# Patient Record
Sex: Female | Born: 1960 | Race: Black or African American | Hispanic: No | Marital: Married | Smoking: Former smoker
Health system: Southern US, Community
[De-identification: ages and names within clinical notes are randomized; demographics above are authoritative.]

## PROBLEM LIST (undated history)

## (undated) DIAGNOSIS — K5790 Diverticulosis of intestine, part unspecified, without perforation or abscess without bleeding: Secondary | ICD-10-CM

## (undated) DIAGNOSIS — H269 Unspecified cataract: Secondary | ICD-10-CM

## (undated) DIAGNOSIS — E785 Hyperlipidemia, unspecified: Secondary | ICD-10-CM

## (undated) DIAGNOSIS — H8109 Meniere's disease, unspecified ear: Secondary | ICD-10-CM

## (undated) DIAGNOSIS — K219 Gastro-esophageal reflux disease without esophagitis: Secondary | ICD-10-CM

## (undated) DIAGNOSIS — R011 Cardiac murmur, unspecified: Secondary | ICD-10-CM

## (undated) DIAGNOSIS — M1990 Unspecified osteoarthritis, unspecified site: Secondary | ICD-10-CM

## (undated) DIAGNOSIS — M810 Age-related osteoporosis without current pathological fracture: Secondary | ICD-10-CM

## (undated) DIAGNOSIS — L409 Psoriasis, unspecified: Secondary | ICD-10-CM

## (undated) DIAGNOSIS — Z973 Presence of spectacles and contact lenses: Secondary | ICD-10-CM

## (undated) DIAGNOSIS — T7840XA Allergy, unspecified, initial encounter: Secondary | ICD-10-CM

## (undated) DIAGNOSIS — F419 Anxiety disorder, unspecified: Secondary | ICD-10-CM

## (undated) DIAGNOSIS — J302 Other seasonal allergic rhinitis: Secondary | ICD-10-CM

## (undated) DIAGNOSIS — C50919 Malignant neoplasm of unspecified site of unspecified female breast: Secondary | ICD-10-CM

## (undated) HISTORY — PX: COLONOSCOPY: SHX174

## (undated) HISTORY — DX: Psoriasis, unspecified: L40.9

## (undated) HISTORY — DX: Allergy, unspecified, initial encounter: T78.40XA

## (undated) HISTORY — DX: Unspecified osteoarthritis, unspecified site: M19.90

## (undated) HISTORY — DX: Malignant neoplasm of unspecified site of unspecified female breast: C50.919

## (undated) HISTORY — DX: Age-related osteoporosis without current pathological fracture: M81.0

## (undated) HISTORY — DX: Cardiac murmur, unspecified: R01.1

## (undated) HISTORY — DX: Gastro-esophageal reflux disease without esophagitis: K21.9

## (undated) HISTORY — DX: Diverticulosis of intestine, part unspecified, without perforation or abscess without bleeding: K57.90

## (undated) HISTORY — DX: Meniere's disease, unspecified ear: H81.09

## (undated) HISTORY — DX: Unspecified cataract: H26.9

## (undated) HISTORY — DX: Hyperlipidemia, unspecified: E78.5

---

## 1989-02-09 HISTORY — PX: ABDOMINAL HYSTERECTOMY: SHX81

## 2000-03-23 ENCOUNTER — Encounter: Admission: RE | Admit: 2000-03-23 | Discharge: 2000-03-23 | Payer: Self-pay | Admitting: Obstetrics & Gynecology

## 2000-03-26 ENCOUNTER — Encounter: Admission: RE | Admit: 2000-03-26 | Discharge: 2000-03-26 | Payer: Self-pay | Admitting: Obstetrics & Gynecology

## 2000-03-26 ENCOUNTER — Encounter: Payer: Self-pay | Admitting: Obstetrics & Gynecology

## 2000-03-31 ENCOUNTER — Encounter (INDEPENDENT_AMBULATORY_CARE_PROVIDER_SITE_OTHER): Payer: Self-pay | Admitting: Specialist

## 2000-03-31 ENCOUNTER — Ambulatory Visit (HOSPITAL_BASED_OUTPATIENT_CLINIC_OR_DEPARTMENT_OTHER): Admission: RE | Admit: 2000-03-31 | Discharge: 2000-03-31 | Payer: Self-pay | Admitting: General Surgery

## 2000-03-31 HISTORY — PX: BREAST EXCISIONAL BIOPSY: SUR124

## 2001-12-19 ENCOUNTER — Encounter: Payer: Self-pay | Admitting: *Deleted

## 2001-12-19 ENCOUNTER — Emergency Department (HOSPITAL_COMMUNITY): Admission: EM | Admit: 2001-12-19 | Discharge: 2001-12-19 | Payer: Self-pay | Admitting: *Deleted

## 2001-12-19 ENCOUNTER — Encounter: Payer: Self-pay | Admitting: Emergency Medicine

## 2002-12-26 ENCOUNTER — Ambulatory Visit (HOSPITAL_COMMUNITY): Admission: RE | Admit: 2002-12-26 | Discharge: 2002-12-26 | Payer: Self-pay | Admitting: Gastroenterology

## 2003-09-12 ENCOUNTER — Emergency Department (HOSPITAL_COMMUNITY): Admission: EM | Admit: 2003-09-12 | Discharge: 2003-09-12 | Payer: Self-pay

## 2006-02-09 HISTORY — PX: BREAST EXCISIONAL BIOPSY: SUR124

## 2006-06-03 ENCOUNTER — Encounter: Admission: RE | Admit: 2006-06-03 | Discharge: 2006-06-03 | Payer: Self-pay | Admitting: *Deleted

## 2006-06-14 ENCOUNTER — Encounter: Admission: RE | Admit: 2006-06-14 | Discharge: 2006-06-14 | Payer: Self-pay | Admitting: General Surgery

## 2006-06-15 ENCOUNTER — Ambulatory Visit (HOSPITAL_BASED_OUTPATIENT_CLINIC_OR_DEPARTMENT_OTHER): Admission: RE | Admit: 2006-06-15 | Discharge: 2006-06-15 | Payer: Self-pay | Admitting: General Surgery

## 2006-06-15 ENCOUNTER — Encounter (INDEPENDENT_AMBULATORY_CARE_PROVIDER_SITE_OTHER): Payer: Self-pay | Admitting: Specialist

## 2008-03-05 ENCOUNTER — Other Ambulatory Visit: Admission: RE | Admit: 2008-03-05 | Discharge: 2008-03-05 | Payer: Self-pay | Admitting: Family Medicine

## 2008-07-26 ENCOUNTER — Encounter: Admission: RE | Admit: 2008-07-26 | Discharge: 2008-07-26 | Payer: Self-pay | Admitting: Family Medicine

## 2008-08-01 ENCOUNTER — Encounter: Admission: RE | Admit: 2008-08-01 | Discharge: 2008-08-01 | Payer: Self-pay | Admitting: Family Medicine

## 2010-06-27 NOTE — Op Note (Signed)
NAME:  Tamara Brandt, Tamara Brandt          ACCOUNT NO.:  [id number]   MEDICAL RECORD NO.:  [id number]          PATIENT TYPE:  AMB   LOCATION:  DSC                          FACILITY:  [facility]   PHYSICIAN:  Adolph Pollack, M.D.DATE OF BIRTH:  29-May-1960   DATE OF PROCEDURE:  06/15/2006  DATE OF DISCHARGE:                               OPERATIVE REPORT   PREOPERATIVE DIAGNOSIS:  Complex right breast mass.   POSTOPERATIVE DIAGNOSIS:  Complex right breast mass.   PROCEDURE:  Excision of right breast mass.   SURGEON:  Adolph Pollack, M.D.   ANESTHESIA:  General.   INDICATIONS:  Ms. Marcene Corning is a 50 year old female who noted a mass in  the upper inner quadrant of the right breast on her self-exam.  Mammogram demonstrated multiple nodules/masses in the inner quadrant of  the right breast at the 1 and 2 o'clock position.  They were somewhat  complex and diagnosis uncertain, but by way of radiography, biopsy has  been recommended and she presents for that.  We discussed the procedure  risks and aftercare preoperatively.   TECHNIQUE:  She is seen in the holding area and the right breast marked  with my initials.  She was then brought to the operating room, placed  supine on the operating table, and general anesthetic was administered.  The right breast was sterilely prepped and draped.  In the upper inner  quadrant of the right breast, a curvilinear incision was made through  the skin and subcutaneous tissue.  Flaps were made in all directions by  dissecting some subcutaneous tissue away from the skin.  I was able to  palpate the complex set of masses, and using electrocautery, excised  these along with normal tissue surrounding them.  I then marked the  medial aspect of the specimen with a single suture and the anterior  aspect with a double suture.  This was sent fresh to pathology.   Following this, I inspected the biopsy cavity.  I injected with 0.5%  Marcaine.  Bleeding points were  controlled with electrocautery.  Once  hemostasis was adequate, I then closed the subcutaneous tissue with  interrupted 3-0 Vicryl sutures.  The skin was closed with a 4-0 Monocryl  subcuticular stitch.  Steri-Strips and sterile dressings were applied.   She tolerated the procedure well without any apparent complications and  was taken to the recovery room in satisfactory condition.      Adolph Pollack, M.D.  Electronically Signed     TJR/MEDQ  D:  06/15/2006  T:  06/15/2006  Job:  [id number]   cc:   Carma Leaven, DO

## 2010-06-27 NOTE — Op Note (Signed)
[place]  Patient:    Tamara Brandt, Tamara Brandt                   MRN: [id number] Proc. Date: 03/31/00 Adm. Date:  [id number] Attending:  Tempie Donning CC:         Gordy Savers, M.D.  Dr. Tamela Oddi   Operative Report  PREOPERATIVE DIAGNOSIS:  Left breast mass, multiple papillomas.  POSTOPERATIVE DIAGNOSIS:  Left breast, multiple papillomas, pending pathology.  PROCEDURE:  Excision, left breast mass.  SURGEON:  Gita Kudo, M.D.  ANESTHESIA:  General.  CLINICAL SUMMARY:  A 50 year old woman with tender nodular area in her left breast.  It has been present for several years and enlarging.  Mammogram showed it to be moderately stable but somewhat suspicious, and biopsy was recommended.  Ultrasound also showed multiple nodules.  OPERATIVE FINDINGS:  The area at the 6 oclock position in the left breast appeared to be a well-lobulated and discrete area that was around 6 cm in diameter.  DESCRIPTION OF PROCEDURE:  Under satisfactory general endotracheal anesthesia, the patients left breast and chest were prepped and draped in a standard fashion.  A curved incision made around the areola from the 9 through 3 oclock positions and small skin flaps developed.  The mass was quite evident and was removed using a combination of sharp dissection and cautery.  Bleeders were cauterized.  The entire mass was removed with a small rim of normal-appearing breast tissue.  The breast was then infiltrated with 20 cc of Marcaine for postoperative analgesia and closed in layers with 3-0 Vicryl. Skin edges approximated with interrupted and running 4-0 nylon.  A sterile compressive dressing applied, and the patient went to the recovery room from the operating room in good condition without complication. DD:  03/31/00 TD:  03/31/00 Job: [id number]

## 2010-06-27 NOTE — Op Note (Signed)
NAME:  Tamara Brandt, Tamara Brandt                      ACCOUNT NO.:  [id number]   MEDICAL RECORD NO.:  [id number]                   PATIENT TYPE:  AMB   LOCATION:  ENDO                                 FACILITY:  [facility]   PHYSICIAN:  Graylin Shiver, M.D.                DATE OF BIRTH:  1960-02-18   DATE OF PROCEDURE:  12/26/2002  DATE OF DISCHARGE:                                 OPERATIVE REPORT   PROCEDURE PERFORMED:  Colonoscopy.   INDICATIONS FOR PROCEDURE:  Heme positive stool.   Informed consent was obtained after explanation of the risks of bleeding,  infection, and perforation.   PREMEDICATIONS:  Fentanyl 100 mcg  IV, Versed 10 mg IV.   DESCRIPTION OF PROCEDURE:  With the patient in the left lateral decubitus  position, a rectal exam was performed and no masses were felt.  The Olympus  colonoscope was inserted into the rectum and advanced around the colon to  the cecum.  Cecal landmarks were identified.  The cecum and ascending colon  were normal.  The transverse colon was normal.  The descending colon and  sigmoid revealed diverticulosis.  The rectum was normal.  The patient  tolerated the procedure well without complications.   IMPRESSION:  Diverticulosis of the left colon.                                               Graylin Shiver, M.D.    SFG/MEDQ  D:  12/26/2002  T:  12/26/2002  Job:  [id number]   cc:   Thelma Barge P. Modesto Charon, M.D.  [address]  [place]  [place] [address]  Fax: [phone]

## 2012-01-13 ENCOUNTER — Encounter: Payer: Self-pay | Admitting: Internal Medicine

## 2012-02-04 ENCOUNTER — Ambulatory Visit (INDEPENDENT_AMBULATORY_CARE_PROVIDER_SITE_OTHER): Payer: BC Managed Care – PPO | Admitting: Internal Medicine

## 2012-02-04 ENCOUNTER — Encounter: Payer: Self-pay | Admitting: Internal Medicine

## 2012-02-04 VITALS — BP 130/82 | HR 68 | Ht 65.0 in | Wt 188.0 lb

## 2012-02-04 DIAGNOSIS — K219 Gastro-esophageal reflux disease without esophagitis: Secondary | ICD-10-CM

## 2012-02-04 DIAGNOSIS — R1310 Dysphagia, unspecified: Secondary | ICD-10-CM

## 2012-02-04 DIAGNOSIS — R1013 Epigastric pain: Secondary | ICD-10-CM

## 2012-02-04 DIAGNOSIS — Z1211 Encounter for screening for malignant neoplasm of colon: Secondary | ICD-10-CM

## 2012-02-04 MED ORDER — MOVIPREP 100 G PO SOLR: 1.0000 | Freq: Once | ORAL | Status: DC

## 2012-02-04 NOTE — Patient Instructions (Addendum)
You have been scheduled for an endoscopy and colonoscopy with propofol. Please follow the written instructions given to you at your visit today. Please pick up your prep at the pharmacy within the next 1-3 days. If you use inhalers (even only as needed) or a CPAP machine, please bring them with you on the day of your procedure.  You have been given brochures on reflux, endoscopy, and colonoscopy

## 2012-02-04 NOTE — Progress Notes (Signed)
HISTORY OF PRESENT ILLNESS:  Tamara Brandt is a 51 y.o. female who presents today regarding management of reflux and other GI complaints. The patient states that she was awoken in August 2013 with gastric to pharyngeal regurgitation with associated burning, coughing, and difficulty breathing. Since that time she has had recurrent problems with regurgitation and pyrosis and other symptoms. Initially placed on Dexilant for 10 days. She did not feel this helped. Subsequently 30 days of Nexium, which did not help. Currently 2 weeks into a second course of Nexium, with improvement, though incompletely, and symptoms. She also mentions dysphagia to large pills only. She states that she has been watching her diet recently, using hello juice, and chewing gum. This seems to help. Current symptoms include significant belching, increased intestinal gas, upper abdominal discomfort, sore throat with sour taste in mouth, hoarseness, increased phlegm production, and previous problems with water bran (now resolved). He is due she is now or regurgitation and bloating. She continues on PPI. She has gained approximately 45 pounds over the past 2 years. Some nausea but no vomiting. No lower GI complaints. She is known to have diverticulosis on colonoscopy 10 years ago (Dr. Evette Cristal [facility], November 2004). Review of outside blood work from October 2013 shows unremarkable comprehensive metabolic panel, CBC, and negative Helicobacter pylori antibody  REVIEW OF SYSTEMS:  All non-GI ROS negative except for allergies, anxiety, arthritis, back pain, visual change, heart murmur, itching, night sweats, skin rash, sleeping problems, sore throat, urinary leakage, voice change  Past Medical History  Diagnosis Date  . Diverticulosis   . GERD (gastroesophageal reflux disease)   . Psoriasis     Past Surgical History  Procedure Date  . Abdominal hysterectomy   . Breast lumpectomy     x2  . Cesarean section     Social  History Tamara Brandt  reports that she has quit smoking. She has never used smokeless tobacco. She reports that she does not drink alcohol or use illicit drugs.  family history includes Diabetes in her father; Heart disease in her mother and paternal aunt; and Rheum arthritis in her mother.  Allergies  Allergen Reactions  . Hydrocodone Rash       PHYSICAL EXAMINATION: Vital signs: BP 130/82  Pulse 68  Ht 5\' 5"  (1.651 m)  Wt 188 lb (85.276 kg)  BMI 31.28 kg/m2  Constitutional: generally well-appearing, no acute distress Psychiatric: alert and oriented x3, cooperative Eyes: extraocular movements intact, anicteric, conjunctiva pink Mouth: oral pharynx moist, no lesions Neck: supple no lymphadenopathy Cardiovascular: heart regular rate and rhythm, very soft murmur Lungs: clear to auscultation bilaterally Abdomen: soft,obese, nontender, nondistended, no obvious ascites, no peritoneal signs, normal bowel sounds, no organomegaly Rectal:for until colonoscopy Extremities: no lower extremity edema bilaterally Skin: no lesions on visible extremities Neuro: No focal deficits.   ASSESSMENT:  #1. GERD. Classic symptoms improve with PPI. Atypical symptoms possibly related to GERD, not improved. Significant regurgitant component. Mild dysphagia as well. #2. Dyspepsia, bloating, upper abdominal discomfort #3. Colorectal neoplasia screening. Baseline risk. Due for followup as this is her 10th year upcoming   PLAN:  #1. Long discussion today on the pathophysiology of reflux disease #2. Literature provided on reflux disease #3. Reflux precautions. In particular, weight loss, avoidance of large meals, avoidance of eating close to bedtime #4. Upper endoscopy to evaluate reflux and dyspeptic symptoms as well as mild dysphagia.The nature of the procedure, as well as the risks, benefits, and alternatives were carefully and thoroughly reviewed with the patient.  Ample time for discussion and  questions allowed. The patient understood, was satisfied, and agreed to proceed.  #5. Screening colonoscopy.The nature of the procedure, as well as the risks, benefits, and alternatives were carefully and thoroughly reviewed with the patient. Ample time for discussion and questions allowed. The patient understood, was satisfied, and agreed to proceed.  #6. Movi prep prescribed. The patient instructed on its use #7. Continue PPI

## 2012-02-08 ENCOUNTER — Ambulatory Visit (INDEPENDENT_AMBULATORY_CARE_PROVIDER_SITE_OTHER): Payer: BC Managed Care – PPO | Admitting: Physician Assistant

## 2012-02-08 VITALS — BP 130/76 | HR 101 | Temp 100.0°F | Resp 20 | Wt 183.0 lb

## 2012-02-08 DIAGNOSIS — R509 Fever, unspecified: Secondary | ICD-10-CM

## 2012-02-08 DIAGNOSIS — R059 Cough, unspecified: Secondary | ICD-10-CM

## 2012-02-08 DIAGNOSIS — R05 Cough: Secondary | ICD-10-CM

## 2012-02-08 DIAGNOSIS — J111 Influenza due to unidentified influenza virus with other respiratory manifestations: Secondary | ICD-10-CM

## 2012-02-08 LAB — POCT INFLUENZA A/B: Influenza B, POC: NEGATIVE

## 2012-02-08 MED ORDER — ALBUTEROL SULFATE HFA 108 (90 BASE) MCG/ACT IN AERS: 2.0000 | INHALATION_SPRAY | RESPIRATORY_TRACT | Status: DC | PRN

## 2012-02-08 MED ORDER — OSELTAMIVIR PHOSPHATE 75 MG PO CAPS: 75.0000 mg | ORAL_CAPSULE | Freq: Two times a day (BID) | ORAL | Status: DC

## 2012-02-08 MED ORDER — ALBUTEROL SULFATE (2.5 MG/3ML) 0.083% IN NEBU
2.5000 mg | INHALATION_SOLUTION | Freq: Once | RESPIRATORY_TRACT | Status: AC
  Administered 2012-02-08: 2.5 mg via RESPIRATORY_TRACT

## 2012-02-08 MED ORDER — GUAIFENESIN-CODEINE 100-10 MG/5ML PO SOLN: ORAL | Status: DC

## 2012-02-08 NOTE — Progress Notes (Signed)
Patient ID: Tamara Brandt MRN: [id number], DOB: [birth year], 51 y.o. Date of Encounter: 02/08/2012, 9:52 AM  Primary Physician: Gaye Alken, MD  Chief Complaint: Flu symptoms for 2 days  HPI: 51 y.o. year old female with history below presents a two day history of sudden onset of fever, chills, myalgias, cough, sore throat, and congestion. T max uncertain, she does not have a thermometer at home. Her cough is not productive and is not associated with time of the day. Cough is however keeping her awake at night. When she gets into a coughing episode she will develop some wheezing and shortness of breath, but otherwise she is without wheezing or shortness of breath. She does have a generalized headache that is worse with her coughing. She also has some mild nausea without emesis. No diarrhea. Slightly decreased appetite, but she is eating some soup and pushing fluids. Her grandchildren were sick with similar symptoms over Christmas and did cough in her face a few times. She did receive an influenza vaccine this year a couple months ago through her employer.   Past Medical History  Diagnosis Date  . Diverticulosis   . GERD (gastroesophageal reflux disease)   . Psoriasis   . Allergy   . Arthritis   . Heart murmur      Home Meds: Prior to Admission medications   Medication Sig Start Date End Date Taking? Authorizing Provider  esomeprazole (NEXIUM) 40 MG capsule Take 40 mg by mouth daily before breakfast.   Yes Historical Provider, MD  hydrALAZINE (APRESOLINE) 25 MG tablet Take 25 mg by mouth as needed.   Yes Historical Provider, MD  triamcinolone (KENALOG) 0.025 % cream Apply 1 application topically 2 (two) times daily.   Yes Historical Provider, MD    Allergies:  Allergies  Allergen Reactions  . Hydrocodone Rash    History   Social History  . Marital Status: Married    Spouse Name: N/A    Number of Children: 1  . Years of Education: N/A   Occupational History    . Receiptionist [employer]   Social History Main Topics  . Smoking status: Former Games developer  . Smokeless tobacco: Never Used  . Alcohol Use: No  . Drug Use: No  . Sexually Active: Not on file   Other Topics Concern  . Not on file   Social History Narrative  . No narrative on file     Review of Systems: Constitutional: Positive for fever, chills, myalgias, and fatigue. Negative for weight changes.  HEENT: see above Cardiovascular: Negative for chest pain or palpitations Respiratory: Positive for cough and wheezing. Negative for hemoptysis, shortness of breath, tachypnea, or dyspnea Abdominal: Negative for abdominal pain, nausea, vomiting, diarrhea, or constipation Dermatological: Negative for rash. Neurologic: Positive for headache. Negative for dizziness, vertigo, or syncope.   Physical Exam: Blood pressure 130/76, pulse 101, temperature 100 F (37.8 C), temperature source Oral, resp. rate 20, weight 183 lb (83.008 kg), SpO2 98.00%., There is no height on file to calculate BMI. General: Well developed, well nourished, in no acute distress. Head: Normocephalic, atraumatic, eyes without discharge, sclera non-icteric, nares are congested. Bilateral auditory canals clear, TM's are without perforation, pearly grey and translucent with reflective cone of light bilaterally. Oral cavity moist, posterior pharynx without exudate, erythema, peritonsillar abscess, or post nasal drip. Uvula midline.  Neck: Supple. No thyromegaly. Full ROM. No lymphadenopathy. Lungs: Mild expiratory wheezing bilaterally without rales or rhonchi. Breathing is unlabored. Status post Albuterol nebulizer:  Heart: RRR  with S1 S2. 2/6 murmur. No rubs or gallops appreciated. Msk:  Strength and tone normal for age. Extremities/Skin: Warm and dry. No clubbing or cyanosis. No edema. No rashes or suspicious lesions. Neuro: Alert and oriented X 3. Moves all extremities spontaneously. Gait is normal. CNII-XII grossly in  tact. Psych:  Responds to questions appropriately with a normal affect.   Labs: Results for orders placed in visit on 02/08/12  POCT INFLUENZA A/B      Component Value Range   Influenza A, POC Positive     Influenza B, POC Negative       ASSESSMENT AND PLAN:  51 y.o. year old female with influenza A, fever, and cough. -Tamiflu 75 mg 1 po bid #10 no RF -Hycodan #4oz 1 tsp po q 4-6 hours prn cough no RF, SED -Tylenol/Motrin -Push fluids -Rest -Will need to be out of work until afebrile 24 hours without the aid of antipyretics -Out of work note written through 02/11/12 -RTC precautions  Signed, Eula Listen, PA-C 02/08/2012 9:52 AM

## 2012-03-11 ENCOUNTER — Encounter: Payer: BC Managed Care – PPO | Admitting: Internal Medicine

## 2012-03-24 ENCOUNTER — Encounter: Payer: BC Managed Care – PPO | Admitting: Internal Medicine

## 2012-04-09 DIAGNOSIS — C50919 Malignant neoplasm of unspecified site of unspecified female breast: Secondary | ICD-10-CM

## 2012-04-09 HISTORY — DX: Malignant neoplasm of unspecified site of unspecified female breast: C50.919

## 2012-04-19 ENCOUNTER — Other Ambulatory Visit: Payer: Self-pay

## 2012-04-19 DIAGNOSIS — Z1231 Encounter for screening mammogram for malignant neoplasm of breast: Secondary | ICD-10-CM

## 2012-05-02 ENCOUNTER — Telehealth: Payer: Self-pay | Admitting: Internal Medicine

## 2012-05-04 NOTE — Telephone Encounter (Signed)
Informed patient that I had printed corrected instructions reflecting new date;  Patient requested I fax them to her and agreed to call if she had any questions about the new instructions.  Instructions faxed

## 2012-05-04 NOTE — Telephone Encounter (Signed)
Reprinted corrected instructions reflecting new date of procedure; called patient and LM on VM for her to call me back so I can get fax number to fax the instructions per her request

## 2012-05-09 ENCOUNTER — Ambulatory Visit
Admission: RE | Admit: 2012-05-09 | Discharge: 2012-05-09 | Disposition: A | Discharge status: Home / Self Care | Payer: BC Managed Care – PPO | Source: Ambulatory Visit

## 2012-05-09 DIAGNOSIS — Z1231 Encounter for screening mammogram for malignant neoplasm of breast: Secondary | ICD-10-CM

## 2012-05-11 ENCOUNTER — Encounter: Payer: Self-pay | Admitting: Internal Medicine

## 2012-05-11 ENCOUNTER — Other Ambulatory Visit: Payer: Self-pay | Admitting: Family Medicine

## 2012-05-11 ENCOUNTER — Ambulatory Visit (AMBULATORY_SURGERY_CENTER): Payer: BC Managed Care – PPO | Admitting: Internal Medicine

## 2012-05-11 VITALS — BP 154/68 | HR 51 | Temp 97.6°F | Resp 15 | Ht 65.0 in | Wt 188.0 lb

## 2012-05-11 DIAGNOSIS — R1310 Dysphagia, unspecified: Secondary | ICD-10-CM

## 2012-05-11 DIAGNOSIS — R928 Other abnormal and inconclusive findings on diagnostic imaging of breast: Secondary | ICD-10-CM

## 2012-05-11 DIAGNOSIS — K219 Gastro-esophageal reflux disease without esophagitis: Secondary | ICD-10-CM

## 2012-05-11 DIAGNOSIS — Z1211 Encounter for screening for malignant neoplasm of colon: Secondary | ICD-10-CM

## 2012-05-11 DIAGNOSIS — R1013 Epigastric pain: Secondary | ICD-10-CM

## 2012-05-11 MED ORDER — SODIUM CHLORIDE 0.9 % IV SOLN: 500.0000 mL | INTRAVENOUS | Status: DC

## 2012-05-11 MED ORDER — DEXTROSE 5 % IV SOLN: INTRAVENOUS | Status: DC

## 2012-05-11 NOTE — Patient Instructions (Addendum)
YOU HAD AN ENDOSCOPIC PROCEDURE TODAY AT THE [facility]: Refer to the procedure report that was given to you for any specific questions about what was found during the examination.  If the procedure report does not answer your questions, please call your gastroenterologist to clarify.  If you requested that your care partner not be given the details of your procedure findings, then the procedure report has been included in a sealed envelope for you to review at your convenience later.  YOU SHOULD EXPECT: Some feelings of bloating in the abdomen. Passage of more gas than usual.  Walking can help get rid of the air that was put into your GI tract during the procedure and reduce the bloating. If you had a lower endoscopy (such as a colonoscopy or flexible sigmoidoscopy) you may notice spotting of blood in your stool or on the toilet paper. If you underwent a bowel prep for your procedure, then you may not have a normal bowel movement for a few days.  DIET: Your first meal following the procedure should be a light meal and then it is ok to progress to your normal diet.  A half-sandwich or bowl of soup is an example of a good first meal.  Heavy or fried foods are harder to digest and may make you feel nauseous or bloated.  Likewise meals heavy in dairy and vegetables can cause extra gas to form and this can also increase the bloating.  Drink plenty of fluids but you should avoid alcoholic beverages for 24 hours.  ACTIVITY: Your care partner should take you home directly after the procedure.  You should plan to take it easy, moving slowly for the rest of the day.  You can resume normal activity the day after the procedure however you should NOT DRIVE or use heavy machinery for 24 hours (because of the sedation medicines used during the test).    SYMPTOMS TO REPORT IMMEDIATELY: A gastroenterologist can be reached at any hour.  During normal business hours, 8:30 AM to 5:00 PM Monday through Friday,  call [phone].  After hours and on weekends, please call the GI answering service at [phone] emergency number  who will take a message and have the physician on call contact you.   Following lower endoscopy (colonoscopy or flexible sigmoidoscopy):  Excessive amounts of blood in the stool  Significant tenderness or worsening of abdominal pains  Swelling of the abdomen that is new, acute  Fever of 100F or higher  Following upper endoscopy (EGD)  Vomiting of blood or coffee ground material  New chest pain or pain under the shoulder blades  Painful or persistently difficult swallowing  New shortness of breath  Fever of 100F or higher  Black, tarry-looking stools  FOLLOW UP: If any biopsies were taken you will be contacted by phone or by letter within the next 1-3 weeks.  Call your gastroenterologist if you have not heard about the biopsies in 3 weeks.  Our staff will call the home number listed on your records the next business day following your procedure to check on you and address any questions or concerns that you may have at that time regarding the information given to you following your procedure. This is a courtesy call and so if there is no answer at the home number and we have not heard from you through the emergency physician on call, we will assume that you have returned to your regular daily activities without incident.  SIGNATURES/CONFIDENTIALITY: You  and/or your care partner have signed paperwork which will be entered into your electronic medical record.  These signatures attest to the fact that that the information above on your After Visit Summary has been reviewed and is understood.  Full responsibility of the confidentiality of this discharge information lies with you and/or your care-partner.  Handouts on diverticulosis Stay on Nexium as directed. Return to care of dr Zachery Dauer Repeat colonoscopy  in 10 years

## 2012-05-11 NOTE — Op Note (Signed)
[facility] [address] [place], [address]   COLONOSCOPY PROCEDURE REPORT  PATIENT: [name], [name]  MR#: [id number] BIRTHDATE: 09/07/1960 , 51  yrs. old GENDER: Female ENDOSCOPIST: Roxy Cedar, MD REFERRED XB:[id number] Barnes, M.D. PROCEDURE DATE:  05/11/2012 PROCEDURE:   Colonoscopy, screening ASA CLASS:   Class II INDICATIONS:Average risk patient for colon cancer. MEDICATIONS: MAC sedation, administered by CRNA and propofol (Diprivan) 300mg  IV  DESCRIPTION OF PROCEDURE:   After the risks benefits and alternatives of the procedure were thoroughly explained, informed consent was obtained.  A digital rectal exam revealed no abnormalities of the rectum.   The LB [id number]  endoscope was introduced through the anus and advanced to the cecum, which was identified by both the appendix and ileocecal valve. No adverse events experienced.   The quality of the prep was good, using MoviPrep  The instrument was then slowly withdrawn as the colon was fully examined.      COLON FINDINGS: Moderate diverticulosis was noted The finding was in the right  and  left colon.   The colon was otherwise normal. There was no inflammation, polyps or cancers .  Retroflexed views revealed no abnormalities. The time to cecum=2 minutes 10 seconds. Withdrawal time=9 minutes 07 seconds.  The scope was withdrawn and the procedure completed.  COMPLICATIONS: There were no complications.  ENDOSCOPIC IMPRESSION: 1.   Moderate diverticulosis was noted in the right  and left colon 2.   The colon was otherwise normal  RECOMMENDATIONS: 1.  Continue current colorectal screening recommendations for "routine risk" patients with a repeat colonoscopy in 10 years. 2.  Upper endoscopy today (see report)   eSigned:  Roxy Cedar, MD 05/11/2012 11:54 AM   cc: Juluis Rainier, MD and The Patient   PATIENT NAME:  Tamara Brandt, Tamara Brandt MR#: [id number]

## 2012-05-11 NOTE — Progress Notes (Signed)
Patient did not experience any of the following events: a burn prior to discharge; a fall within the facility; wrong site/side/patient/procedure/implant event; or a hospital transfer or hospital admission upon discharge from the facility. (G8907)Patient did not have preoperative order for IV antibiotic SSI prophylaxis. (G8918) ewm 

## 2012-05-11 NOTE — Op Note (Signed)
[facility] [address] [place], [address]   ENDOSCOPY PROCEDURE REPORT  PATIENT: Tamara Brandt, Tamara Brandt  MR#: [id number] BIRTHDATE: Jan 03, 1961 , 51  yrs. old GENDER: Female ENDOSCOPIST: Roxy Cedar, MD REFERRED BY:  Juluis Rainier, M.D. PROCEDURE DATE:  05/11/2012 PROCEDURE:  EGD, diagnostic ASA CLASS:     Class II INDICATIONS:  History of esophageal reflux.   Dysphagia (large pills). MEDICATIONS: MAC sedation, administered by CRNA and propofol (Diprivan) 150mg  IV TOPICAL ANESTHETIC: none  DESCRIPTION OF PROCEDURE: After the risks benefits and alternatives of the procedure were thoroughly explained, informed consent was obtained.  The LB GIF-H180 [id number] endoscope was introduced through the mouth and advanced to the second portion of the duodenum. Without limitations.  The instrument was slowly withdrawn as the mucosa was fully examined.      The upper, middle and distal third of the esophagus were carefully inspected and no abnormalities were noted.  The z-line was well seen at the GEJ.  The endoscope was pushed into the fundus which was normal including a retroflexed view.  The antrum, gastric body, first and second part of the duodenum were unremarkable. Retroflexed views revealed no abnormalities.     The scope was then withdrawn from the patient and the procedure completed.  COMPLICATIONS: There were no complications. ENDOSCOPIC IMPRESSION: 1. Normal EGD 2. GERD  RECOMMENDATIONS: 1.  Anti-reflux regimen to be followed 2.  Continue Nexium 3. Return to the care of Dr. Zachery Dauer  REPEAT EXAM:  eSigned:  Roxy Cedar, MD 05/11/2012 12:02 PM   XB:[id number] Zachery Dauer, MD and The Patient

## 2012-05-12 ENCOUNTER — Telehealth: Payer: Self-pay | Admitting: *Deleted

## 2012-05-12 NOTE — Telephone Encounter (Signed)
  Follow up Call-  Call back number 05/11/2012  Post procedure Call Back phone  # [phone]  Permission to leave phone message Yes     Patient questions:  Do you have a fever, pain , or abdominal swelling? no Pain Score  0 *  Have you tolerated food without any problems? yes  Have you been able to return to your normal activities? yes  Do you have any questions about your discharge instructions: Diet   no Medications  no Follow up visit  no  Do you have questions or concerns about your Care? no  Actions: * If pain score is 4 or above: No action needed, pain <4.  Pt states that her jaw was a little swollen on each side, advised pt that in procedure they may have been holding her jaw closed or have moved her, advised to watch and call us back if it gets worse

## 2012-05-20 ENCOUNTER — Ambulatory Visit
Admission: RE | Admit: 2012-05-20 | Discharge: 2012-05-20 | Disposition: A | Discharge status: Home / Self Care | Payer: BC Managed Care – PPO | Source: Ambulatory Visit | Attending: Family Medicine | Admitting: Family Medicine

## 2012-05-20 ENCOUNTER — Other Ambulatory Visit: Payer: Self-pay | Admitting: Family Medicine

## 2012-05-20 DIAGNOSIS — R928 Other abnormal and inconclusive findings on diagnostic imaging of breast: Secondary | ICD-10-CM

## 2012-05-20 DIAGNOSIS — N6320 Unspecified lump in the left breast, unspecified quadrant: Secondary | ICD-10-CM

## 2012-05-27 ENCOUNTER — Ambulatory Visit
Admission: RE | Admit: 2012-05-27 | Discharge: 2012-05-27 | Disposition: A | Discharge status: Home / Self Care | Payer: BC Managed Care – PPO | Source: Ambulatory Visit | Attending: Family Medicine | Admitting: Family Medicine

## 2012-05-27 DIAGNOSIS — N6320 Unspecified lump in the left breast, unspecified quadrant: Secondary | ICD-10-CM

## 2012-06-10 ENCOUNTER — Ambulatory Visit (INDEPENDENT_AMBULATORY_CARE_PROVIDER_SITE_OTHER): Payer: BC Managed Care – PPO | Admitting: Surgery

## 2012-06-10 ENCOUNTER — Encounter (INDEPENDENT_AMBULATORY_CARE_PROVIDER_SITE_OTHER): Payer: Self-pay | Admitting: Surgery

## 2012-06-10 VITALS — BP 154/70 | HR 64 | Temp 97.4°F | Resp 16 | Ht 63.0 in | Wt 176.0 lb

## 2012-06-10 DIAGNOSIS — R928 Other abnormal and inconclusive findings on diagnostic imaging of breast: Secondary | ICD-10-CM

## 2012-06-10 NOTE — Progress Notes (Signed)
NAME: Tamara Brandt DOB: 03/19/1960 MRN: [id number]                                                                                      DATE: 06/10/2012  PCP: BARNES,ELIZABETH STEWART, MD Referring Provider: Barnes, Elizabeth Stewa*  IMPRESSION:  Complex sclerosing lesion, left breast medial aspect  PLAN:   wire localized excision of this area I have discussed the indications for the lumpectomy and described the procedure. She understand that the chance of removal of the abnormal area is very good, but that occasionally we are unable to locate it and may have to do a second procedure. We also discussed the possibility of a second procedure to get additional tissue. Risks of surgery such as bleeding and infection have also been explained, as well as the implications of not doing the surgery. She understands and wishes to proceed.                  CC:  Chief Complaint  Patient presents with  . Breast Problem    eval lt br - complex sclorosing lesion    HPI:  Tamara Brandt is a 52 y.o.  female who presents for evaluation of A. Left breast a complex sclerosing lesion. She had a routine mammogram done and an abnormality was found in the medial aspect of the left breast. A needle core biopsy was done showing a complex sclerosing lesion and excisional biopsy is recommended. She is not having any current breast symptoms or problems. She has 2 prior breast biopsies done both of which were benign diseases.  PMH:  has a past medical history of Diverticulosis; GERD (gastroesophageal reflux disease); Psoriasis; Allergy; Arthritis; and Heart murmur.  PSH:   has past surgical history that includes Abdominal hysterectomy; Breast lumpectomy; and Cesarean section.  ALLERGIES:   Allergies  Allergen Reactions  . Hydrocodone Rash    MEDICATIONS: Current outpatient prescriptions:BIOTIN 5000 PO, Take by mouth., Disp: , Rfl: ;  esomeprazole (NEXIUM) 40 MG capsule, Take 40 mg by mouth daily before  breakfast., Disp: , Rfl: ;  hydrALAZINE (APRESOLINE) 25 MG tablet, Take 25 mg by mouth as needed., Disp: , Rfl: ;  OVER THE COUNTER MEDICATION, Vitamin D, Disp: , Rfl: ;  OVER THE COUNTER MEDICATION, Vitamin B, Disp: , Rfl: ;  Probiotic Product (PROBIOTIC DAILY PO), Take by mouth., Disp: , Rfl:  triamcinolone (KENALOG) 0.025 % cream, Apply 1 application topically 2 (two) times daily., Disp: , Rfl:   ROS: She has filled out our 12 point review of systems and it is negative . EXAM:   VITAL SIGNS:  BP 154/70  Pulse 64  Temp(Src) 97.4 F (36.3 C) (Temporal)  Resp 16  Ht 5' 3" (1.6 m)  Wt 176 lb (79.833 kg)  BMI 31.18 kg/m2  GENERAL:  The patient is alert, oriented, and generally healthy-appearing, NAD. Mood and affect are normal.  HEENT:  The head is normocephalic, the eyes nonicteric, the pupils were round regular and equal. EOMs are normal. Pharynx normal. Dentition good.  NECK:  The neck is supple and there are no masses or thyromegaly.  LUNGS: Normal respirations and clear to auscultation.    HEART: Regular rhythm, with no murmurs rubs or gallops. Pulses are intact carotid dorsalis pedis and posterior tibial. No significant varicosities are noted.  BREASTS:  the breasts are essentially symmetric. There is a keloid scar in the upper inner quadrant of the right breast. There is ecchymosis in the medial aspect of the left breast from recent biopsy. There is no mass palpable in either breast.  LYMPHATICS: There is no axillary or supraventricular adenopathy  ABDOMEN: Soft, flat, and nontender. No masses or organomegaly is noted. No hernias are noted. Bowel sounds are normal.  EXTREMITIES:  Good range of motion, no edema.   DATA REVIEWED:  I reviewed the mammogram and ultrasound reports and the pathology report    Sakiya Stepka J 06/10/2012  CC: Barnes, Elizabeth Stewa*, BARNES,ELIZABETH STEWART, MD        

## 2012-06-10 NOTE — Patient Instructions (Signed)
We will schedule surgery to remove the abnormal area from your left breast

## 2012-06-14 ENCOUNTER — Encounter (HOSPITAL_BASED_OUTPATIENT_CLINIC_OR_DEPARTMENT_OTHER): Payer: Self-pay | Admitting: *Deleted

## 2012-06-14 NOTE — Progress Notes (Signed)
No labs needed-no htn -has had breast bx in past Newly married

## 2012-06-17 ENCOUNTER — Encounter (INDEPENDENT_AMBULATORY_CARE_PROVIDER_SITE_OTHER): Payer: BC Managed Care – PPO | Admitting: Surgery

## 2012-06-21 ENCOUNTER — Ambulatory Visit
Admission: RE | Admit: 2012-06-21 | Discharge: 2012-06-21 | Disposition: A | Discharge status: Home / Self Care | Payer: BC Managed Care – PPO | Source: Ambulatory Visit | Attending: Surgery | Admitting: Surgery

## 2012-06-21 ENCOUNTER — Encounter (HOSPITAL_BASED_OUTPATIENT_CLINIC_OR_DEPARTMENT_OTHER)
Admission: RE | Disposition: A | Discharge status: Home / Self Care | Payer: Self-pay | Source: Ambulatory Visit | Attending: Surgery

## 2012-06-21 ENCOUNTER — Ambulatory Visit (HOSPITAL_BASED_OUTPATIENT_CLINIC_OR_DEPARTMENT_OTHER): Payer: BC Managed Care – PPO | Admitting: Anesthesiology

## 2012-06-21 ENCOUNTER — Encounter (HOSPITAL_BASED_OUTPATIENT_CLINIC_OR_DEPARTMENT_OTHER): Payer: Self-pay | Admitting: Anesthesiology

## 2012-06-21 ENCOUNTER — Ambulatory Visit (HOSPITAL_BASED_OUTPATIENT_CLINIC_OR_DEPARTMENT_OTHER)
Admission: RE | Admit: 2012-06-21 | Discharge: 2012-06-21 | Disposition: A | Discharge status: Home / Self Care | Payer: BC Managed Care – PPO | Source: Ambulatory Visit | Attending: Surgery | Admitting: Surgery

## 2012-06-21 DIAGNOSIS — M129 Arthropathy, unspecified: Secondary | ICD-10-CM | POA: Insufficient documentation

## 2012-06-21 DIAGNOSIS — L408 Other psoriasis: Secondary | ICD-10-CM | POA: Insufficient documentation

## 2012-06-21 DIAGNOSIS — L910 Hypertrophic scar: Secondary | ICD-10-CM | POA: Insufficient documentation

## 2012-06-21 DIAGNOSIS — D0590 Unspecified type of carcinoma in situ of unspecified breast: Secondary | ICD-10-CM

## 2012-06-21 DIAGNOSIS — Z79899 Other long term (current) drug therapy: Secondary | ICD-10-CM | POA: Insufficient documentation

## 2012-06-21 DIAGNOSIS — R928 Other abnormal and inconclusive findings on diagnostic imaging of breast: Secondary | ICD-10-CM

## 2012-06-21 DIAGNOSIS — J309 Allergic rhinitis, unspecified: Secondary | ICD-10-CM | POA: Insufficient documentation

## 2012-06-21 DIAGNOSIS — R011 Cardiac murmur, unspecified: Secondary | ICD-10-CM | POA: Insufficient documentation

## 2012-06-21 DIAGNOSIS — Z885 Allergy status to narcotic agent status: Secondary | ICD-10-CM | POA: Insufficient documentation

## 2012-06-21 DIAGNOSIS — K219 Gastro-esophageal reflux disease without esophagitis: Secondary | ICD-10-CM | POA: Insufficient documentation

## 2012-06-21 DIAGNOSIS — K5730 Diverticulosis of large intestine without perforation or abscess without bleeding: Secondary | ICD-10-CM | POA: Insufficient documentation

## 2012-06-21 HISTORY — DX: Presence of spectacles and contact lenses: Z97.3

## 2012-06-21 HISTORY — PX: BREAST BIOPSY: SHX20

## 2012-06-21 HISTORY — DX: Other seasonal allergic rhinitis: J30.2

## 2012-06-21 HISTORY — PX: BREAST LUMPECTOMY: SHX2

## 2012-06-21 SURGERY — BREAST BIOPSY WITH NEEDLE LOCALIZATION
Anesthesia: General | Site: Breast | Laterality: Left | Wound class: Clean

## 2012-06-21 MED ORDER — OXYCODONE HCL 5 MG PO TABS: 5.0000 mg | ORAL_TABLET | Freq: Once | ORAL | Status: DC | PRN

## 2012-06-21 MED ORDER — FENTANYL CITRATE 0.05 MG/ML IJ SOLN: 50.0000 ug | INTRAMUSCULAR | Status: DC | PRN

## 2012-06-21 MED ORDER — MIDAZOLAM HCL 5 MG/5ML IJ SOLN
INTRAMUSCULAR | Status: DC | PRN
  Administered 2012-06-21: 2 mg via INTRAVENOUS

## 2012-06-21 MED ORDER — ONDANSETRON HCL 4 MG/2ML IJ SOLN
INTRAMUSCULAR | Status: DC | PRN
  Administered 2012-06-21: 4 mg via INTRAVENOUS

## 2012-06-21 MED ORDER — HYDROCODONE-ACETAMINOPHEN 5-325 MG PO TABS: 1.0000 | ORAL_TABLET | Freq: Four times a day (QID) | ORAL | Status: DC | PRN

## 2012-06-21 MED ORDER — MIDAZOLAM HCL 2 MG/2ML IJ SOLN: 0.5000 mg | Freq: Once | INTRAMUSCULAR | Status: DC | PRN

## 2012-06-21 MED ORDER — FENTANYL CITRATE 0.05 MG/ML IJ SOLN
25.0000 ug | INTRAMUSCULAR | Status: DC | PRN
  Administered 2012-06-21: 50 ug via INTRAVENOUS
  Administered 2012-06-21: 25 ug via INTRAVENOUS

## 2012-06-21 MED ORDER — SUCCINYLCHOLINE CHLORIDE 20 MG/ML IJ SOLN
INTRAMUSCULAR | Status: DC | PRN
  Administered 2012-06-21: 100 mg via INTRAVENOUS

## 2012-06-21 MED ORDER — LIDOCAINE HCL (CARDIAC) 20 MG/ML IV SOLN
INTRAVENOUS | Status: DC | PRN
  Administered 2012-06-21: 75 mg via INTRAVENOUS

## 2012-06-21 MED ORDER — PROPOFOL 10 MG/ML IV BOLUS
INTRAVENOUS | Status: DC | PRN
  Administered 2012-06-21: 150 mg via INTRAVENOUS

## 2012-06-21 MED ORDER — EPHEDRINE SULFATE 50 MG/ML IJ SOLN
INTRAMUSCULAR | Status: DC | PRN
  Administered 2012-06-21 (×2): 10 mg via INTRAVENOUS

## 2012-06-21 MED ORDER — PROMETHAZINE HCL 25 MG/ML IJ SOLN: 6.2500 mg | INTRAMUSCULAR | Status: DC | PRN

## 2012-06-21 MED ORDER — MIDAZOLAM HCL 2 MG/2ML IJ SOLN: 1.0000 mg | INTRAMUSCULAR | Status: DC | PRN

## 2012-06-21 MED ORDER — LACTATED RINGERS IV SOLN
INTRAVENOUS | Status: DC
  Administered 2012-06-21 (×2): via INTRAVENOUS

## 2012-06-21 MED ORDER — MEPERIDINE HCL 25 MG/ML IJ SOLN: 6.2500 mg | INTRAMUSCULAR | Status: DC | PRN

## 2012-06-21 MED ORDER — CEFAZOLIN SODIUM-DEXTROSE 2-3 GM-% IV SOLR
2.0000 g | INTRAVENOUS | Status: AC
  Administered 2012-06-21: 2 g via INTRAVENOUS

## 2012-06-21 MED ORDER — BUPIVACAINE HCL (PF) 0.25 % IJ SOLN
INTRAMUSCULAR | Status: DC | PRN
  Administered 2012-06-21: 30 mL

## 2012-06-21 MED ORDER — CHLORHEXIDINE GLUCONATE 4 % EX LIQD: 1.0000 "application " | Freq: Once | CUTANEOUS | Status: DC

## 2012-06-21 MED ORDER — FENTANYL CITRATE 0.05 MG/ML IJ SOLN
INTRAMUSCULAR | Status: DC | PRN
  Administered 2012-06-21: 100 ug via INTRAVENOUS

## 2012-06-21 MED ORDER — DEXAMETHASONE SODIUM PHOSPHATE 4 MG/ML IJ SOLN
INTRAMUSCULAR | Status: DC | PRN
  Administered 2012-06-21: 8 mg via INTRAVENOUS

## 2012-06-21 MED ORDER — OXYCODONE HCL 5 MG/5ML PO SOLN: 5.0000 mg | Freq: Once | ORAL | Status: DC | PRN

## 2012-06-21 MED ORDER — ACETAMINOPHEN 10 MG/ML IV SOLN
1000.0000 mg | Freq: Once | INTRAVENOUS | Status: AC
  Administered 2012-06-21: 1000 mg via INTRAVENOUS

## 2012-06-21 SURGICAL SUPPLY — 46 items
BANDAGE ELASTIC 6 VELCRO ST LF (GAUZE/BANDAGES/DRESSINGS) IMPLANT
BENZOIN TINCTURE PRP APPL 2/3 (GAUZE/BANDAGES/DRESSINGS) IMPLANT
BINDER BREAST LRG (GAUZE/BANDAGES/DRESSINGS) IMPLANT
BINDER BREAST MEDIUM (GAUZE/BANDAGES/DRESSINGS) IMPLANT
BINDER BREAST XLRG (GAUZE/BANDAGES/DRESSINGS) ×2 IMPLANT
BINDER BREAST XXLRG (GAUZE/BANDAGES/DRESSINGS) IMPLANT
BLADE SURG 10 STRL SS (BLADE) ×2 IMPLANT
BLADE SURG 15 STRL LF DISP TIS (BLADE) ×1 IMPLANT
BLADE SURG 15 STRL SS (BLADE) ×1
CANISTER SUCTION 1200CC (MISCELLANEOUS) ×2 IMPLANT
CHLORAPREP W/TINT 26ML (MISCELLANEOUS) ×2 IMPLANT
CLIP TI WIDE RED SMALL 6 (CLIP) IMPLANT
CLOTH BEACON ORANGE TIMEOUT ST (SAFETY) ×2 IMPLANT
COVER MAYO STAND STRL (DRAPES) ×2 IMPLANT
COVER TABLE BACK 60X90 (DRAPES) ×2 IMPLANT
DECANTER SPIKE VIAL GLASS SM (MISCELLANEOUS) IMPLANT
DERMABOND ADVANCED (GAUZE/BANDAGES/DRESSINGS) ×1
DERMABOND ADVANCED .7 DNX12 (GAUZE/BANDAGES/DRESSINGS) ×1 IMPLANT
DEVICE DUBIN W/COMP PLATE 8390 (MISCELLANEOUS) ×2 IMPLANT
DRAPE PED LAPAROTOMY (DRAPES) ×2 IMPLANT
DRAPE UTILITY XL STRL (DRAPES) ×2 IMPLANT
ELECT COATED BLADE 2.86 ST (ELECTRODE) ×2 IMPLANT
ELECT REM PT RETURN 9FT ADLT (ELECTROSURGICAL) ×2
ELECTRODE REM PT RTRN 9FT ADLT (ELECTROSURGICAL) ×1 IMPLANT
GAUZE SPONGE 4X4 12PLY STRL LF (GAUZE/BANDAGES/DRESSINGS) ×4 IMPLANT
GAUZE SPONGE 4X4 16PLY XRAY LF (GAUZE/BANDAGES/DRESSINGS) IMPLANT
GLOVE ECLIPSE 6.5 STRL STRAW (GLOVE) ×4 IMPLANT
GLOVE INDICATOR 6.5 STRL GRN (GLOVE) ×2 IMPLANT
GLOVE SURG SIGNA 7.5 PF LTX (GLOVE) ×2 IMPLANT
GOWN PREVENTION PLUS XLARGE (GOWN DISPOSABLE) ×4 IMPLANT
KIT MARKER MARGIN INK (KITS) ×2 IMPLANT
NDL SAFETY ECLIPSE 18X1.5 (NEEDLE) IMPLANT
NEEDLE HYPO 18GX1.5 SHARP (NEEDLE)
NEEDLE HYPO 25X1 1.5 SAFETY (NEEDLE) ×2 IMPLANT
NS IRRIG 1000ML POUR BTL (IV SOLUTION) ×2 IMPLANT
PACK BASIN DAY SURGERY FS (CUSTOM PROCEDURE TRAY) ×2 IMPLANT
PENCIL BUTTON HOLSTER BLD 10FT (ELECTRODE) ×2 IMPLANT
SLEEVE SCD COMPRESS KNEE MED (MISCELLANEOUS) ×2 IMPLANT
SPONGE LAP 4X18 X RAY DECT (DISPOSABLE) ×2 IMPLANT
STRIP CLOSURE SKIN 1/4X4 (GAUZE/BANDAGES/DRESSINGS) IMPLANT
SUT VIC AB 5-0 PS2 18 (SUTURE) ×2 IMPLANT
SUT VICRYL 3-0 CR8 SH (SUTURE) ×2 IMPLANT
SYR CONTROL 10ML LL (SYRINGE) ×2 IMPLANT
TOWEL OR NON WOVEN STRL DISP B (DISPOSABLE) IMPLANT
TUBE CONNECTING 20X1/4 (TUBING) ×2 IMPLANT
YANKAUER SUCT BULB TIP NO VENT (SUCTIONS) ×2 IMPLANT

## 2012-06-21 NOTE — Anesthesia Postprocedure Evaluation (Signed)
  Anesthesia Post-op Note  Patient: Tamara Brandt  Procedure(s) Performed: Procedure(s): BREAST BIOPSY WITH NEEDLE LOCALIZATION (Left)  Patient Location: PACU  Anesthesia Type:General  Level of Consciousness: awake, alert , oriented and patient cooperative  Airway and Oxygen Therapy: Patient Spontanous Breathing  Post-op Pain: none  Post-op Assessment: Post-op Vital signs reviewed, Patient's Cardiovascular Status Stable, Respiratory Function Stable, Patent Airway, No signs of Nausea or vomiting and Pain level controlled  Post-op Vital Signs: Reviewed and stable  Complications: No apparent anesthesia complications

## 2012-06-21 NOTE — Interval H&P Note (Signed)
History and Physical Interval Note:  06/21/2012 10:48 AM  Tamara Brandt  has presented today for surgery, with the diagnosis of left breast mass  The various methods of treatment have been discussed with the patient and family.  Her husband is here with her.  Her last name was Privette until about 1 and 1/2 years ago.  Dr. Jamey Ripa was originally supposed to do the surgery, but because of medical reasons asked me to do the case.  After consideration of risks, benefits and other options for treatment, the patient has consented to  Procedure(s): BREAST BIOPSY WITH NEEDLE LOCALIZATION (Left) as a surgical intervention .  The patient's history has been reviewed, patient examined, no change in status, stable for surgery.    I have reviewed the patient's chart and labs.  Questions were answered to the patient's satisfaction.     Christabell Loseke H

## 2012-06-21 NOTE — Transfer of Care (Signed)
Immediate Anesthesia Transfer of Care Note  Patient: Tamara Brandt  Procedure(s) Performed: Procedure(s): BREAST BIOPSY WITH NEEDLE LOCALIZATION (Left)  Patient Location: PACU  Anesthesia Type:General  Level of Consciousness: awake, alert  and oriented  Airway & Oxygen Therapy: Patient Spontanous Breathing and Patient connected to face mask oxygen  Post-op Assessment: Report given to PACU RN, Post -op Vital signs reviewed and stable and Patient moving all extremities  Post vital signs: Reviewed and stable  Complications: No apparent anesthesia complications

## 2012-06-21 NOTE — H&P (View-Only) (Signed)
Tamara Brandt DOB: Jun 18, 1960 MRN: [id number]                                                                                      DATE: 06/10/2012  PCP: Gaye Alken, MD Referring Provider: Marthe Patch*  IMPRESSION:  Complex sclerosing lesion, left breast medial aspect  PLAN:   wire localized excision of this area I have discussed the indications for the lumpectomy and described the procedure. She understand that the chance of removal of the abnormal area is very good, but that occasionally we are unable to locate it and may have to do a second procedure. We also discussed the possibility of a second procedure to get additional tissue. Risks of surgery such as bleeding and infection have also been explained, as well as the implications of not doing the surgery. She understands and wishes to proceed.                  CC:  Chief Complaint  Patient presents with  . Breast Problem    eval lt br - complex sclorosing lesion    HPI:  Tamara Brandt is a 52 y.o.  female who presents for evaluation of A. Left breast a complex sclerosing lesion. She had a routine mammogram done and an abnormality was found in the medial aspect of the left breast. A needle core biopsy was done showing a complex sclerosing lesion and excisional biopsy is recommended. She is not having any current breast symptoms or problems. She has 2 prior breast biopsies done both of which were benign diseases.  PMH:  has a past medical history of Diverticulosis; GERD (gastroesophageal reflux disease); Psoriasis; Allergy; Arthritis; and Heart murmur.  PSH:   has past surgical history that includes Abdominal hysterectomy; Breast lumpectomy; and Cesarean section.  ALLERGIES:   Allergies  Allergen Reactions  . Hydrocodone Rash    MEDICATIONS: Current outpatient prescriptions:BIOTIN 5000 PO, Take by mouth., Disp: , Rfl: ;  esomeprazole (NEXIUM) 40 MG capsule, Take 40 mg by mouth daily before  breakfast., Disp: , Rfl: ;  hydrALAZINE (APRESOLINE) 25 MG tablet, Take 25 mg by mouth as needed., Disp: , Rfl: ;  OVER THE COUNTER MEDICATION, Vitamin D, Disp: , Rfl: ;  OVER THE COUNTER MEDICATION, Vitamin B, Disp: , Rfl: ;  Probiotic Product (PROBIOTIC DAILY PO), Take by mouth., Disp: , Rfl:  triamcinolone (KENALOG) 0.025 % cream, Apply 1 application topically 2 (two) times daily., Disp: , Rfl:   ROS: She has filled out our 12 point review of systems and it is negative . EXAM:   VITAL SIGNS:  BP 154/70  Pulse 64  Temp(Src) 97.4 F (36.3 C) (Temporal)  Resp 16  Ht 5\' 3"  (1.6 m)  Wt 176 lb (79.833 kg)  BMI 31.18 kg/m2  GENERAL:  The patient is alert, oriented, and generally healthy-appearing, NAD. Mood and affect are normal.  HEENT:  The head is normocephalic, the eyes nonicteric, the pupils were round regular and equal. EOMs are normal. Pharynx normal. Dentition good.  NECK:  The neck is supple and there are no masses or thyromegaly.  LUNGS: Normal respirations and clear to auscultation.  HEART: Regular rhythm, with no murmurs rubs or gallops. Pulses are intact carotid dorsalis pedis and posterior tibial. No significant varicosities are noted.  BREASTS:  the breasts are essentially symmetric. There is a keloid scar in the upper inner quadrant of the right breast. There is ecchymosis in the medial aspect of the left breast from recent biopsy. There is no mass palpable in either breast.  LYMPHATICS: There is no axillary or supraventricular adenopathy  ABDOMEN: Soft, flat, and nontender. No masses or organomegaly is noted. No hernias are noted. Bowel sounds are normal.  EXTREMITIES:  Good range of motion, no edema.   DATA REVIEWED:  I reviewed the mammogram and ultrasound reports and the pathology report    Lacy Sofia J 06/10/2012  CC: Marthe Patch*, Gaye Alken, MD

## 2012-06-21 NOTE — Anesthesia Procedure Notes (Signed)
Procedure Name: Intubation Date/Time: 06/21/2012 11:04 AM Performed by: Zenia Resides D Pre-anesthesia Checklist: Patient identified, Emergency Drugs available, Suction available and Patient being monitored Patient Re-evaluated:Patient Re-evaluated prior to inductionOxygen Delivery Method: Circle System Utilized Preoxygenation: Pre-oxygenation with 100% oxygen Intubation Type: IV induction and Cricoid Pressure applied Ventilation: Mask ventilation without difficulty Laryngoscope Size: Mac and 3 Grade View: Grade I Tube type: Oral Tube size: 7.0 mm Number of attempts: 1 Airway Equipment and Method: stylet and oral airway Placement Confirmation: ETT inserted through vocal cords under direct vision,  positive ETCO2 and breath sounds checked- equal and bilateral Secured at: 23 cm Tube secured with: Tape Dental Injury: Teeth and Oropharynx as per pre-operative assessment

## 2012-06-21 NOTE — Op Note (Signed)
06/21/2012  12:13 PM  PATIENT:  Tamara Brandt DOB: 09/04/1960 MRN: [id number]  PREOP DIAGNOSIS:  left breast complex sclerosing lesion , 9 o'clock position  POSTOP DIAGNOSIS:   Left breast complex sclerosing lesion , 9 o'clock position.  Final path pending.  PROCEDURE:   Procedure(s):  Left BREAST BIOPSY WITH NEEDLE LOCALIZATION  SURGEON:   Ovidio Kin, M.D.  ANESTHESIA:   general  Anesthesiologist: Germaine Pomfret, MD CRNA: Curly Shores, CRNA; Ronnette Hila, CRNA  General  EBL:  minimal  ml  DRAINS: none   LOCAL MEDICATIONS USED:   30 cc 1/4% marcaine  SPECIMEN:   Left breast mass  COUNTS CORRECT:  YES  INDICATIONS FOR PROCEDURE:  Tamara Brandt is a 52 y.o. (DOB: Oct 14, 1960) AA  female whose primary care physician is Gaye Alken, MD and comes for left breast lumpectomy.   The patient had an abnormality in her left breast that underwent a core biopsy on 05/27/2012.  The biopsy showed a complex sclerosing lesion (Ascesion number: [id number]) and it was thought best that this be excised for complete pathologic evaluation.  Dr. Ivor Reining orginally saw the patient and had the surgery scheduled.  But he is unable to preform the surgery for medical reasons, she did not want to wait for a later date, so I am doing the surgery.  I have discussed the plan with the patient.    The indications and potential complications of surgery were explained to the patient. Potential complications include, but are not limited to, bleeding, infection, the need for further surgery, and nerve injury.     The patient had a needle loc wire placed at the 9 o'clock position of the left breast at [facility] by Dr. Robyne Peers.  OPERATIVE NOTE:  The patient was taken to room # 6 at [facility] where she underwent a general anesthesia  supervised by Anesthesiologist: E. Jairo Ben, MD CRNA: Curly Shores, CRNA; Ronnette Hila, CRNA. Her left breast and axilla were prepped with  ChloraPrep  and sterilely draped.   A time-out and the surgical check list was reviewed.   I turned attention to the lesion which was about at the 9 o'clock position of the left breast.  It looks like the clip and lesion are under the medial left nipple.   I cut down around the wire and tried to take an ellipse of breast tissue around the tumor by at least 1 cm.   I excised this block of breast tissue approximately 5 cm by 5 cm  in diameter. I did take the dissection down to the pectoralis major. I painted this specimen with the 6 color paint kit and did a specimen mammogram which confirmed the mass, clip and the wire were all in the right position.   I then irrigated the wound with saline. I infiltrated approximately 30 mL of 1% local between the  Incisions.   I then closed all the wounds in layers using 3-0 Vicryl sutures for the deep layer. At the skin, I closed the incisions with a 5-0 Monocryl suture. The incisions were then painted with Dermabond.  She had gauze place over the wounds and placed in a breast binder.  The patient tolerated the procedure well, was transported to the recovery room in good condition. Sponge and needle count were correct at the end of the case.   Final pathology is pending.  Ovidio Kin, MD, [facility] Pager: [phone] Office phone:  [phone]

## 2012-06-21 NOTE — Anesthesia Preprocedure Evaluation (Addendum)
Anesthesia Evaluation  Patient identified by MRN, date of birth, ID band Patient awake    Reviewed: Allergy & Precautions, H&P , NPO status , Patient's Chart, lab work & pertinent test results, reviewed documented beta blocker date and time   Airway Mallampati: II TM Distance: >3 FB Neck ROM: Full    Dental  (+) Loose and Dental Advisory Given   Pulmonary former smoker,  breath sounds clear to auscultation  Pulmonary exam normal       Cardiovascular - CHF and - Orthopnea negative cardio ROS  + Valvular Problems/Murmurs Rhythm:Regular Rate:Normal     Neuro/Psych negative neurological ROS     GI/Hepatic Neg liver ROS, GERD-  Medicated and Poorly Controlled,  Endo/Other  negative endocrine ROS  Renal/GU negative Renal ROS     Musculoskeletal   Abdominal (+) + obese,   Peds  Hematology   Anesthesia Other Findings   Reproductive/Obstetrics                          Anesthesia Physical Anesthesia Plan  ASA: II  Anesthesia Plan: General   Post-op Pain Management:    Induction: Intravenous  Airway Management Planned: Oral ETT  Additional Equipment:   Intra-op Plan:   Post-operative Plan: Extubation in OR  Informed Consent: I have reviewed the patients History and Physical, chart, labs and discussed the procedure including the risks, benefits and alternatives for the proposed anesthesia with the patient or authorized representative who has indicated his/her understanding and acceptance.   Dental advisory given  Plan Discussed with: CRNA and Surgeon  Anesthesia Plan Comments: (Plan routine monitors, GETA )        Anesthesia Quick Evaluation

## 2012-06-22 ENCOUNTER — Encounter (HOSPITAL_BASED_OUTPATIENT_CLINIC_OR_DEPARTMENT_OTHER): Payer: Self-pay | Admitting: Surgery

## 2012-06-23 ENCOUNTER — Telehealth (INDEPENDENT_AMBULATORY_CARE_PROVIDER_SITE_OTHER): Payer: Self-pay | Admitting: *Deleted

## 2012-06-23 NOTE — Telephone Encounter (Signed)
Patient called to make PO appt for next week per Dr. Ezzard Standing patient said.

## 2012-06-24 ENCOUNTER — Telehealth (INDEPENDENT_AMBULATORY_CARE_PROVIDER_SITE_OTHER): Payer: Self-pay

## 2012-06-29 ENCOUNTER — Encounter (INDEPENDENT_AMBULATORY_CARE_PROVIDER_SITE_OTHER): Payer: Self-pay | Admitting: Surgery

## 2012-06-29 ENCOUNTER — Other Ambulatory Visit (INDEPENDENT_AMBULATORY_CARE_PROVIDER_SITE_OTHER): Payer: Self-pay

## 2012-06-29 ENCOUNTER — Ambulatory Visit (INDEPENDENT_AMBULATORY_CARE_PROVIDER_SITE_OTHER): Payer: BC Managed Care – PPO | Admitting: Surgery

## 2012-06-29 VITALS — BP 136/88 | HR 56 | Temp 97.8°F | Resp 16 | Ht 63.0 in | Wt 175.2 lb

## 2012-06-29 DIAGNOSIS — D0592 Unspecified type of carcinoma in situ of left breast: Secondary | ICD-10-CM

## 2012-06-29 DIAGNOSIS — D0512 Intraductal carcinoma in situ of left breast: Secondary | ICD-10-CM

## 2012-06-29 DIAGNOSIS — D0590 Unspecified type of carcinoma in situ of unspecified breast: Secondary | ICD-10-CM

## 2012-06-29 DIAGNOSIS — D0510 Intraductal carcinoma in situ of unspecified breast: Secondary | ICD-10-CM | POA: Insufficient documentation

## 2012-06-29 NOTE — Progress Notes (Signed)
[facility]  Ovidio Kin, MD,  FACS [address]  [address] [place], [place]    [address] Phone:  [phone] FAX:  [phone]   Re:   Brendaliz Kuk DOB:   [birth year] MRN:   [id number]  ASSESSMENT AND PLAN: 1.  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  ER/PR report pending on path.  Presented at the breast cancer conference 06/29/2012  No need for rad tx, consider anti estrogen therapy to protect the other breast.  Med onc consult.  I will see her back in 6 months.  2.  GERD (gastroesophageal reflux disease) 3.  Psoriasis; Allergy 4.  Arthritis  HISTORY OF PRESENT ILLNESS: Chief Complaint  Patient presents with  . Routine Post Op     reck breast bx    Terianne Pietila is a 52 y.o. (DOB: Aug 02, 1960)  AA  female who is a patient of Gaye Alken, MD and comes to me today for follow up of left breast biopsy which showed DCIS.  She was originally seen by Dr. Jamey Ripa and I did the surgery because he had hurt his thumb.  I reviewed the findings with the patient and the need for further follow up.  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  PHYSICAL EXAM: BP 136/88  Pulse 56  Temp(Src) 97.8 F (36.6 C) (Oral)  Resp 16  Ht 5\' 3"  (1.6 m)  Wt 175 lb 3.2 oz (79.47 kg)  BMI 31.04 kg/m2  HEENT:  Pupils equal.  Dentition good. NECK:  Supple.  No thyroid mass. LYMPH NODES:  No cervical, supraclavicular, or axillary adenopathy. BREASTS -  RIGHT:  No palpable mass or nodule.  No nipple discharge.   LEFT:  Scar at 9 o'clock okay, but sore. UPPER EXTREMITIES:  No evidence of lymphedema.  DATA REVIEWED: Path to patient  Ovidio Kin, MD, FACS Office:  [phone]

## 2012-07-07 ENCOUNTER — Ambulatory Visit (INDEPENDENT_AMBULATORY_CARE_PROVIDER_SITE_OTHER): Payer: BC Managed Care – PPO | Admitting: General Surgery

## 2012-07-07 ENCOUNTER — Encounter (INDEPENDENT_AMBULATORY_CARE_PROVIDER_SITE_OTHER): Payer: Self-pay | Admitting: General Surgery

## 2012-07-07 ENCOUNTER — Encounter: Payer: Self-pay | Admitting: Oncology

## 2012-07-07 VITALS — BP 114/62 | HR 64 | Temp 99.3°F | Resp 16 | Ht 63.0 in | Wt 177.0 lb

## 2012-07-07 DIAGNOSIS — Z09 Encounter for follow-up examination after completed treatment for conditions other than malignant neoplasm: Secondary | ICD-10-CM

## 2012-07-07 DIAGNOSIS — T8140XA Infection following a procedure, unspecified, initial encounter: Secondary | ICD-10-CM

## 2012-07-07 MED ORDER — OXYCODONE-ACETAMINOPHEN 10-325 MG PO TABS: 1.0000 | ORAL_TABLET | Freq: Four times a day (QID) | ORAL | Status: DC | PRN

## 2012-07-07 MED ORDER — CEPHALEXIN 500 MG PO CAPS: 500.0000 mg | ORAL_CAPSULE | Freq: Two times a day (BID) | ORAL | Status: DC

## 2012-07-07 NOTE — Progress Notes (Signed)
Subjective:     Patient ID: Tamara Brandt, female   DOB: [birth year], 52 y.o.   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 She's been doing well until Sunday when she began developing pain in her left breast with burning. This was worse than after operation. On Tuesday she does notice redness of the incision. She has not had a fever. She is at increasing pain and swelling at this site. She comes in today to have this evaluated. She has no drainage.  Review of Systems     Objective:   Physical Exam She has a left breast medial incision that is tender with some edema. This is associated with erythema stretching about 4 cm above the incision.    Assessment:     Status post left breast lumpectomy with wound infection     Plan:     I gave her prescription for some antibiotics and some pain medication as she cannot take hydrocodone she was given. I told her to try to wear the breast binder or a sports bra. I also told her  to place ice on this as well as taken anti-inflammatory. I told her if she gets a fever, this gets worse, she has any drainage to call back otherwise hopefully the oral antibiotics will be rate of this. She will need to see Dr. Ezzard Standing next week.

## 2012-07-12 ENCOUNTER — Ambulatory Visit (INDEPENDENT_AMBULATORY_CARE_PROVIDER_SITE_OTHER): Payer: BC Managed Care – PPO | Admitting: Surgery

## 2012-07-12 ENCOUNTER — Encounter (INDEPENDENT_AMBULATORY_CARE_PROVIDER_SITE_OTHER): Payer: BC Managed Care – PPO | Admitting: Surgery

## 2012-07-12 ENCOUNTER — Encounter (INDEPENDENT_AMBULATORY_CARE_PROVIDER_SITE_OTHER): Payer: Self-pay | Admitting: Surgery

## 2012-07-12 VITALS — BP 110/70 | HR 66 | Temp 98.6°F | Resp 18 | Ht 63.0 in | Wt 177.0 lb

## 2012-07-12 DIAGNOSIS — Z09 Encounter for follow-up examination after completed treatment for conditions other than malignant neoplasm: Secondary | ICD-10-CM

## 2012-07-12 DIAGNOSIS — T8140XA Infection following a procedure, unspecified, initial encounter: Secondary | ICD-10-CM

## 2012-07-12 MED ORDER — CEPHALEXIN 500 MG PO CAPS: 500.0000 mg | ORAL_CAPSULE | Freq: Two times a day (BID) | ORAL | Status: DC

## 2012-07-12 NOTE — Patient Instructions (Signed)
Continue your antibiotic and see me again next week

## 2012-07-12 NOTE — Progress Notes (Signed)
The patient comes back for a recheck. She had a lumpectomy done and apparently developed what appeared to be a wound infection, started on Keflex 500 twice a day last week.she feels a little better but still uncomfortable in the breast area from the incision.  On exam the area is somewhat warm and firm and tender. There is some erythema noted. As discussed with the patient I anesthetized a small area of skin with 1% Xylocaine and aspirated the cavity. I got 35 cc of slightly red clear fluid consistent with seroma. It did not appear purulent.   Impression: Postop seroma with possible early infection  Plan: She'll continue antibiotics and I'll see her next week.

## 2012-07-15 ENCOUNTER — Other Ambulatory Visit: Payer: Self-pay | Admitting: Medical Oncology

## 2012-07-15 DIAGNOSIS — D0512 Intraductal carcinoma in situ of left breast: Secondary | ICD-10-CM

## 2012-07-18 ENCOUNTER — Encounter: Payer: Self-pay | Admitting: Oncology

## 2012-07-18 ENCOUNTER — Encounter: Payer: Self-pay | Admitting: *Deleted

## 2012-07-18 ENCOUNTER — Telehealth: Payer: Self-pay | Admitting: *Deleted

## 2012-07-18 ENCOUNTER — Other Ambulatory Visit (HOSPITAL_BASED_OUTPATIENT_CLINIC_OR_DEPARTMENT_OTHER): Payer: BC Managed Care – PPO | Admitting: Lab

## 2012-07-18 ENCOUNTER — Ambulatory Visit (HOSPITAL_BASED_OUTPATIENT_CLINIC_OR_DEPARTMENT_OTHER): Payer: BC Managed Care – PPO | Admitting: Oncology

## 2012-07-18 ENCOUNTER — Ambulatory Visit: Payer: BC Managed Care – PPO

## 2012-07-18 ENCOUNTER — Ambulatory Visit (HOSPITAL_BASED_OUTPATIENT_CLINIC_OR_DEPARTMENT_OTHER): Payer: BC Managed Care – PPO | Admitting: Lab

## 2012-07-18 VITALS — BP 116/66 | HR 60 | Temp 98.6°F | Resp 20 | Ht 63.0 in | Wt 176.3 lb

## 2012-07-18 DIAGNOSIS — D0592 Unspecified type of carcinoma in situ of left breast: Secondary | ICD-10-CM

## 2012-07-18 DIAGNOSIS — D0590 Unspecified type of carcinoma in situ of unspecified breast: Secondary | ICD-10-CM

## 2012-07-18 DIAGNOSIS — D0512 Intraductal carcinoma in situ of left breast: Secondary | ICD-10-CM

## 2012-07-18 LAB — COMPREHENSIVE METABOLIC PANEL (CC13)
AST: 18 U/L (ref 5–34)
Albumin: 3.9 g/dL (ref 3.5–5.0)
Alkaline Phosphatase: 95 U/L (ref 40–150)
BUN: 8.2 mg/dL (ref 7.0–26.0)
Potassium: 3.8 mEq/L (ref 3.5–5.1)
Sodium: 142 mEq/L (ref 136–145)
Total Bilirubin: 0.52 mg/dL (ref 0.20–1.20)
Total Protein: 8.1 g/dL (ref 6.4–8.3)

## 2012-07-18 LAB — CBC WITH DIFFERENTIAL/PLATELET
EOS%: 4.1 % (ref 0.0–7.0)
LYMPH%: 43.1 % (ref 14.0–49.7)
MCH: 26.1 pg (ref 25.1–34.0)
MCV: 77.7 fL — ABNORMAL LOW (ref 79.5–101.0)
MONO%: 8.1 % (ref 0.0–14.0)
RBC: 4.79 10*6/uL (ref 3.70–5.45)
RDW: 13.3 % (ref 11.2–14.5)

## 2012-07-18 NOTE — Telephone Encounter (Signed)
appts made and printed...td 

## 2012-07-18 NOTE — Progress Notes (Signed)
Tamara Brandt [id number] 01/18/1961 52 y.o. 07/18/2012 1:59 PM  CC  Gaye Alken, MD [address] [place] [address]  Dr. 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  STAGE:  Left breast Tis NX (stage 0) ER+PR+   REFERRING PHYSICIAN: Dr. Ovidio Kin  HISTORY OF PRESENT ILLNESS:  Tamara Brandt is a 52 y.o. female.  Who had a routine mammogram performed and she was found to have an abnormality in the medial aspect of the left breast. Biopsy showed a complex sclerosing lesion and an excisional biopsy was recommended. On 06/21/2012 patient underwent a lumpectomy that showed complex sclerosing lesion with a 0.2 cm focus of low-grade ductal carcinoma in situ. This was in the center of the gross specimen that measured 8.5 x 7.5 x 3.3 cm. The tumor was ER +100% PR +100%. Clinically patient seems to be doing well and is without any complaints. Her case was discussed at the multidisciplinary breast conference. She will be referred to radiation oncology.   Past Medical History: Past Medical History  Diagnosis Date  . Diverticulosis   . GERD (gastroesophageal reflux disease)   . Psoriasis   . Allergy   . Arthritis   . Seasonal allergies   . Wears glasses   . Heart murmur     dx many yr ago-never had an echo    Past Surgical History Past Surgical History  Procedure Laterality Date  . Cesarean section    . Breast lumpectomy  2008    excision rt br mass  . Breast lumpectomy  2002    excision lt br mass  . Colonoscopy      2004  . Abdominal hysterectomy  1991  . Breast biopsy Left 06/21/2012    Procedure: BREAST BIOPSY WITH NEEDLE LOCALIZATION;  Surgeon: Kandis Cocking, MD;  Location: [facility];  Service: General;  Laterality: Left;    Family History: Family History  Problem Relation Age of Onset  . Diabetes Father   . Heart disease Paternal Aunt    . Heart disease Mother   . Rheum arthritis Mother   . Arthritis Mother     Social History History  Substance Use Topics  . Smoking status: Former Smoker    Quit date: 06/15/2002  . Smokeless tobacco: Never Used  . Alcohol Use: Yes     Comment: occ    Allergies: Allergies  Allergen Reactions  . Hydrocodone Rash    Current Medications: Current Outpatient Prescriptions  Medication Sig Dispense Refill  . BIOTIN 5000 PO Take by mouth.      . cephALEXin (KEFLEX) 500 MG capsule Take 1 capsule (500 mg total) by mouth 2 (two) times daily.  14 capsule  0  . cholecalciferol (VITAMIN D) 1000 UNITS tablet Take 1,000 Units by mouth daily.      Marland Kitchen esomeprazole (NEXIUM) 40 MG capsule Take 40 mg by mouth daily before breakfast.      . fexofenadine-pseudoephedrine (ALLEGRA-D) 60-120 MG per tablet Take 1 tablet by mouth as needed.      . hydrALAZINE (APRESOLINE) 25 MG tablet Take 25 mg by mouth as needed (uses for allergies).       . loratadine (CLARITIN) 10 MG tablet Take 10 mg by mouth daily as needed for allergies.      Marland Kitchen oxyCODONE-acetaminophen (PERCOCET) 10-325 MG per tablet Take 1 tablet by mouth every 6 (six) hours as needed for pain.  20 tablet  0  . Probiotic Product (PROBIOTIC DAILY PO) Take by mouth.      . triamcinolone (KENALOG) 0.025 % cream Apply 1 application topically 2 (two) times daily.      . vitamin B-12 (CYANOCOBALAMIN) 1000 MCG tablet Take 1,000 mcg by mouth daily.       No current facility-administered medications for this visit.    OB/GYN History:menarche at age 74, postmenopause surgical, first live birth at 56 G18P1  Fertility Discussion: N/A Prior History of Cancer: N/A  Health Maintenance:  Colonoscopy yes Bone Density no Last PAP smear 2012  ECOG PERFORMANCE STATUS: 0 - Asymptomatic  Genetic Counseling/testing: none  REVIEW OF SYSTEMS:  A comprehensive review of systems was negative.  PHYSICAL EXAMINATION: Blood pressure 116/66, pulse 60,  temperature 98.6 F (37 C), temperature source Oral, resp. rate 20, height 5\' 3"  (1.6 m), weight 176 lb 4.8 oz (79.969 kg). Well-developed nourished female in no acute distress HEENT exam EOMI PERRLA sclerae anicteric no conjunctival pallor oral mucosa is moist neck is supple lungs clear cardiovascular regular rate rhythm abdomen is soft nontender no HSM extremities no edema neuro is nonfocal Left breast reveals well-healed surgical scar no masses nipple discharge. Right breast no masses or nipple discharge.    STUDIES/RESULTS: Mm Lt Plc Breast Loc Dev   1st Lesion  Inc Mammo Guide  06/21/2012   *RADIOLOGY REPORT*  Clinical Data:  Complex sclerosing lesion in the left lower inner quadrant diagnosed by stereotactic core needle biopsy.  LEFT BREAST NEEDLE LOCALIZATION WITH MAMMOGRAPHIC GUIDANCE AND SPECIMEN RADIOGRAPH  Patient presents for needle localization prior to surgical excision.  The patient and I discussed the procedure of needle localization including benefits and alternatives. We discussed the high likelihood of a successful procedure. We discussed the risks of the procedure, including infection, bleeding, tissue injury, and further surgery. Written informed consent was given.  Using mammographic guidance, sterile technique, 2% lidocaine, and a 9 cm modified Kopans needle, the mass and clip in the left lower inner quadrant was localized using a mediolateral approach.  Films were labeled and sent with the patient to surgery.  She tolerated the procedure well.  Specimen radiograph was performed at [facility] and confirms the mass, clip, and wire to be present in the tissue sample.  The specimen is marked for pathology. Results were [facility].  IMPRESSION: Needle localization left breast.  No apparent complications.   Original Report Authenticated By: Cain Saupe, M.D.     LABS:    Chemistry      Component Value Date/Time    NA 142 07/18/2012 1302   K 3.8 07/18/2012 1302   CL 107 07/18/2012 1302   CO2 27 07/18/2012 1302   BUN 8.2 07/18/2012 1302   CREATININE 0.8 07/18/2012 1302      Component Value Date/Time   CALCIUM 9.8 07/18/2012 1302   ALKPHOS 95 07/18/2012 1302   AST 18 07/18/2012 1302   ALT 18 07/18/2012 1302   BILITOT 0.52 07/18/2012 1302      Lab Results  Component Value Date   WBC 6.3 07/18/2012   HGB 12.5 07/18/2012   HCT 37.3 07/18/2012   MCV 77.7* 07/18/2012   PLT 272 07/18/2012   PATHOLOGY: ADDITIONAL INFORMATION: PROGNOSTIC INDICATORS - ACIS Results: IMMUNOHISTOCHEMICAL AND MORPHOMETRIC ANALYSIS BY THE AUTOMATED CELLULAR IMAGING SYSTEM (ACIS) Estrogen Receptor: 100%, POSITIVE, STRONG STAINING INTENSITY Progesterone Receptor: 100%, POSITIVE, STRONG STAINING INTENSITY REFERENCE RANGE ESTROGEN  RECEPTOR NEGATIVE <1% POSITIVE =>1% PROGESTERONE RECEPTOR NEGATIVE <1% POSITIVE =>1% All controls stained appropriately Pecola Leisure MD Pathologist, Electronic Signature ( Signed 06/29/2012) FINAL DIAGNOSIS Diagnosis Breast, lumpectomy, Left - COMPLEX SCLEROSING LESION WITH A 0.2 CM FOCUS OF LOW GRADE DUCTAL CARCINOMA IN SITU 1 of 3 FINAL for Clarington, Marai [phone]) Diagnosis(continued) WITH ASSOCIATED CALCIFICATION. - MARGINS ARE NEGATIVE. - SEE ONCOLOGY TEMPLATE. ADDITIONAL FINDINGS: - FIBROCYSTIC CHANGES WITH USUAL DUCTAL HYPERPLASIA. Microscopic Comment BREAST, IN SITU CARCINOMA Specimen, including laterality: Left partial breast. Procedure: Left breast lumpectomy. Grade of carcinoma: Low grade. Necrosis: No. Estimated tumor size: (glass slide measurement): 0.2 cm. Treatment effect: Not applicable. Distance to closest margin: At least 1 cm (inferior margin). Breast prognostic profile: See below comments. Lymph nodes: No lymph nodes received. TNM: pTis, pNX, MX. Comments: A quantitative estrogen receptor and progesterone receptor will be performed on the focus of low grade ductal carcinoma in  situ and reported in an addendum. Dr. Luisa Hart has seen the focus of low grade ductal carcinoma in situ (slide 1D) in consultation with agreement. (RAH:caf 06/23/12)  ASSESSMENT    52 year old female with  #1 new diagnosis of low-grade DCIS measuring 0.2 cm that is ER positive PR positive. She is status post lumpectomy. Postoperatively she is doing well. And is without any significant problems. She and I discussed treatment options post lumpectomy. Certainly she needs a referral to radiation oncology and I will do that. Patient also would be a good candidate for chemoprevention with tamoxifen 20 mg daily. She has no contraindications to this. We discussed the rationale for chemoprevention in the DCIS setting. We also discussed the side effects. However we will not begin this tell after she was seen by radiation oncology should they plan on doing radiation.  Clinical Trial Eligibility:  Multidisciplinary conference discussion yes     PLAN:    #1 refer to radiation oncology.  #2 I will see the patient back after for consideration of chemoprevention with tamoxifen.       Discussion: Patient is being treated per NCCN breast cancer care guidelines appropriate for stage.0   Thank you so much for allowing me to participate in the care of [facility]. I will continue to follow up the patient with you and assist in her care.  All questions were answered. The patient knows to call the clinic with any problems, questions or concerns. We can certainly see the patient much sooner if necessary.  I spent 40 minutes counseling the patient face to face. The total time spent in the appointment was 60 minutes.  Drue Second, MD Medical/Oncology [facility] [phone] (beeper) [phone] (Office)  08/08/2012, 5:35 PM

## 2012-07-18 NOTE — Progress Notes (Signed)
Checked in new patient. No financial issues. She doesn't have her POA/Living Will with her. She did have her [facility] form.

## 2012-07-19 ENCOUNTER — Encounter (INDEPENDENT_AMBULATORY_CARE_PROVIDER_SITE_OTHER): Payer: Self-pay | Admitting: Surgery

## 2012-07-19 ENCOUNTER — Ambulatory Visit (INDEPENDENT_AMBULATORY_CARE_PROVIDER_SITE_OTHER): Payer: BC Managed Care – PPO | Admitting: Surgery

## 2012-07-19 VITALS — BP 118/70 | HR 74 | Temp 97.9°F | Resp 17

## 2012-07-19 DIAGNOSIS — Z09 Encounter for follow-up examination after completed treatment for conditions other than malignant neoplasm: Secondary | ICD-10-CM

## 2012-07-19 NOTE — Progress Notes (Signed)
The patient comes back for a recheck. She had a lumpectomy done and apparently developed what appeared to be a wound infection, started on Keflex 500 twice a day last week.she feels a little better but still uncomfortable in the breast area from the incision.  On exam the area is somewhat warm and firm and tender. There is some erythema noted. As discussed with the patient I anesthetized a small area of skin with 1% Xylocaine and aspirated the cavity. I got 11 cc of slightly red clear fluid consistent with seroma. It did not appear purulent.   Impression: Postop seroma with possible early infection  Plan: She'll continue antibiotics and see Dr Ezzard Standing next week

## 2012-07-19 NOTE — Patient Instructions (Signed)
See Dr Ezzard Standing next week

## 2012-07-26 ENCOUNTER — Ambulatory Visit (INDEPENDENT_AMBULATORY_CARE_PROVIDER_SITE_OTHER): Payer: BC Managed Care – PPO | Admitting: Surgery

## 2012-07-26 ENCOUNTER — Encounter (INDEPENDENT_AMBULATORY_CARE_PROVIDER_SITE_OTHER): Payer: Self-pay | Admitting: Surgery

## 2012-07-26 VITALS — BP 130/80 | HR 70 | Temp 97.2°F | Resp 16 | Ht 63.0 in | Wt 174.3 lb

## 2012-07-26 DIAGNOSIS — D0512 Intraductal carcinoma in situ of left breast: Secondary | ICD-10-CM

## 2012-07-26 DIAGNOSIS — D0590 Unspecified type of carcinoma in situ of unspecified breast: Secondary | ICD-10-CM

## 2012-07-26 NOTE — Progress Notes (Signed)
[facility]  Ovidio Kin, MD,  FACS [address]  [address] [place], [place]    [address] Phone:  [phone] FAX:  [phone]   Re:   Tamara Brandt DOB:   Apr 26, 1960 MRN:   [id number]  ASSESSMENT AND PLAN: 1.  Left breast DCIS, 9 o'clock  Lumpectomy 06/21/2012  0.2 cm focus of DCIS.  ER - 100%, PR - 100%, in the middle of a complex sclerosing lesion.  Presented at the breast cancer conference 06/29/2012 - No need for rad tx, consider anti estrogen therapy to protect the other breast.  Med onc consult.  She's had a breast infection at the wound site, but has responded to antibiotics.  She was seen by Drs. Streck and Wakefield in my absence.  She has seen Dr. Welton Flakes, who has talked about anti-estrogen tx.  She is also to see radiation oncologist.  By my notes, there was no role for rad tx, but it will not hurt to talk to someone  I will see her back in 6 months.  2.  GERD (gastroesophageal reflux disease) 3.  Psoriasis; Allergy 4.  Arthritis  HISTORY OF PRESENT ILLNESS: Chief Complaint  Patient presents with  . Follow-up    po breast l f/u infection    Tamara Brandt is a 52 y.o. (DOB: 07-31-1960)  AA  female who is a patient of Gaye Alken, MD and comes to me today for follow up of left breast biopsy which showed DCIS.  She was originally seen by Dr. Jamey Ripa and I did the surgery because he had hurt his thumb.  I reviewed the findings with the patient and the need for further follow up. She came this time because she had had a superficial infection which responded to antibiotics.  She is doing better today.  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  PHYSICAL EXAM: BP 130/80  Pulse 70  Temp(Src) 97.2 F (36.2 C) (Temporal)  Resp 16  Ht 5\' 3"  (1.6 m)  Wt 174 lb 5.1 oz (79.071 kg)  BMI 30.89 kg/m2  HEENT:  Pupils equal.  Dentition good. NECK:  Supple.  No thyroid mass. LYMPH NODES:  No cervical,  supraclavicular, or axillary adenopathy. BREASTS -  RIGHT:  No palpable mass or nodule.  No nipple discharge.   LEFT:  Scar at 9 o'clock okay, but sore.  There is some lumpiness.  But otherwise, there is no evidence of infection today. UPPER EXTREMITIES:  No evidence of lymphedema.  DATA REVIEWED: Epic notes.  Ovidio Kin, MD, FACS Office:  [phone]

## 2012-08-01 ENCOUNTER — Other Ambulatory Visit: Payer: Self-pay | Admitting: Oncology

## 2012-08-01 DIAGNOSIS — D0512 Intraductal carcinoma in situ of left breast: Secondary | ICD-10-CM

## 2012-08-02 ENCOUNTER — Telehealth: Payer: Self-pay | Admitting: Oncology

## 2012-08-04 NOTE — Progress Notes (Signed)
Location of Breast Cancer:left lower inner quadrant  Histology per Pathology Report: 05/27/12: Breast, left, needle core biopsy, lower inner quadrant - COMPLEX SCLEROSING LESION,  Receptor Status: ER(+), PR (+), Her2-neu ()  Did patient present with symptoms (if so, please note symptoms) or was this found on screening mammography?: routine mammogram   Past/Anticipated interventions by surgeon, if YQM:VHQI : 06/21/12:Breast, lumpectomy, Left - COMPLEX SCLEROSING LESION WITH A 0.2 CM FOCUS OF LOW GRADE DUCTAL CARCINOMA INSITU1 of 3FINAL for ANETHA, SLAGEL [phone] ASSOCIATED CALCIFICATION. - MARGINS ARE NEGATIVE.- SEE ONCOLOGY TEMPLATE.ADDITIONAL FINDINGS:- FIBROCYSTIC CHANGES WITH USUAL DUCTAL HYPERPLASIA. Microscopic CommentBREAST, IN SITU CARCINOMASpecimen, including laterality: Left partial breast.Procedure: Left breast lumpectomy.Grade of carcinoma: Low grade.Necrosis: No.Estimated tumor size: (glass slide measurement): 0.2 cm. Treatment effect: Not applicable.Distance to closest margin: At least 1 cm (inferior margin).Breast prognostic profile: See below comments.Lymph nodes: No lymph nodes received.TNM: pTis, pNX, MX. DR.David Newman  Developed wound infection,placed on Keflex,spost op seroma,   Breast lumpectomy excision right breast 2008, benign breast lumpectomy 2002 left breast mass excision benign Colonoscopy 2004 ,abd hysterectomy 1991  Past/Anticipated interventions by medical oncology, if any:  Appt Dr.Khan 10/21/12 @245pm  Lymphedema issues, if any:  no   Pain issues, if any:  SAFETY ISSUES:  Prior radiation? no  Pacemaker/ICD? no  Possible current pregnancy?no  Is the patient on methotrexate? no  Current Complaints / other details:  Receptionist at [employer], menarche age 13, 1st live birth age 34 G55P1, postmenopause surgical, keloid scar right upper inner quadrant right breast ,psoriasis,heart mumur ,no family hx breast cancer  Father= DM,Mother=heart disease,rheum arthritis  Allergies: Hydrocodone    Lowella Petties, RN 08/04/2012,3:22 PM

## 2012-08-05 ENCOUNTER — Encounter: Payer: Self-pay | Admitting: Radiation Oncology

## 2012-08-05 ENCOUNTER — Ambulatory Visit
Admission: RE | Admit: 2012-08-05 | Discharge: 2012-08-05 | Disposition: A | Discharge status: Home / Self Care | Payer: BC Managed Care – PPO | Source: Ambulatory Visit | Attending: Radiation Oncology | Admitting: Radiation Oncology

## 2012-08-05 VITALS — BP 157/91 | HR 66 | Temp 98.2°F | Resp 20 | Ht 63.0 in | Wt 178.5 lb

## 2012-08-05 DIAGNOSIS — Z170 Estrogen receptor positive status [ER+]: Secondary | ICD-10-CM | POA: Insufficient documentation

## 2012-08-05 DIAGNOSIS — D0590 Unspecified type of carcinoma in situ of unspecified breast: Secondary | ICD-10-CM | POA: Insufficient documentation

## 2012-08-05 DIAGNOSIS — K5730 Diverticulosis of large intestine without perforation or abscess without bleeding: Secondary | ICD-10-CM | POA: Insufficient documentation

## 2012-08-05 DIAGNOSIS — D0512 Intraductal carcinoma in situ of left breast: Secondary | ICD-10-CM

## 2012-08-05 DIAGNOSIS — K219 Gastro-esophageal reflux disease without esophagitis: Secondary | ICD-10-CM | POA: Insufficient documentation

## 2012-08-05 HISTORY — DX: Anxiety disorder, unspecified: F41.9

## 2012-08-06 NOTE — Progress Notes (Signed)
Radiation Oncology         [phone]) [phone] ________________________________  Initial outpatient Consultation - Date: 08/05/2012   Name: Tamara Brandt MRN: [id number]   DOB: Jul 28, 1960  REFERRING PHYSICIAN: Victorino December, MD  DIAGNOSIS: The encounter diagnosis was DCIS (ductal carcinoma in situ) of breast, left.  HISTORY OF PRESENT ILLNESS::Tamara Brandt is a 52 y.o. female  who had a routine mammogram performed and an abnormality was found in the medial aspect of the left breast. A needle core biopsy was performed which showed a complex grossly lesion and excisional biopsy was recommended. She was asymptomatic from this mass. She had a history of 2 prior breast biopsies in the same breast which were for benign disease. She underwent a lumpectomy on 06/21/2012 which showed a complex sclerosing lesion with a 0.2 cm focus of low-grade ductal carcinoma in situ. This 0.2 cm area was in the center of a gross specimen of 8.5 x 7.5 x 3.3 cm. The tumor was 100% positive for estrogen 100% positive for progesterone. She has spoken with Dr. Welton Flakes regarding tamoxifen for chemoprevention. She was sent to me by Dr. Welton Flakes for consideration of radiation in the management of her disease. She has no history of breast cancer and no family history of breast cancer. She is G1 P1 with menarche at age 28. Her first live child birth was at age 27.  PREVIOUS RADIATION THERAPY: No  PAST MEDICAL HISTORY:  has a past medical history of Diverticulosis; GERD (gastroesophageal reflux disease); Psoriasis; Allergy; Arthritis; Seasonal allergies; Wears glasses; Heart murmur; and Anxiety.    PAST SURGICAL HISTORY: Past Surgical History  Procedure Laterality Date  . Cesarean section    . Breast lumpectomy  2008    excision rt br mass  . Breast lumpectomy  2002    excision lt br mass  . Colonoscopy      2004  . Breast biopsy Left 06/21/2012    Procedure: BREAST BIOPSY WITH NEEDLE LOCALIZATION;  Surgeon: Kandis Cocking, MD;   Location: [facility];  Service: General;  Laterality: Left;  . Abdominal hysterectomy  1991    ovaries intact    FAMILY HISTORY:  Family History  Problem Relation Age of Onset  . Diabetes Father   . Heart disease Paternal Aunt   . Heart disease Mother   . Rheum arthritis Mother   . Arthritis Mother     SOCIAL HISTORY:  History  Substance Use Topics  . Smoking status: Former Smoker    Quit date: 06/15/2002  . Smokeless tobacco: Never Used  . Alcohol Use: Yes     Comment: wine occ    ALLERGIES: Hydrocodone  MEDICATIONS:  Current Outpatient Prescriptions  Medication Sig Dispense Refill  . ALOE VERA JUICE PO Take 120 mLs by mouth daily.       Marland Kitchen BIOTIN 5000 PO Take 5,000 tablets by mouth daily.       Marland Kitchen CALCIUM PO Take 600 tablets by mouth daily. With vit d      . cholecalciferol (VITAMIN D) 1000 UNITS tablet Take 1,000 Units by mouth daily.      Marland Kitchen esomeprazole (NEXIUM) 40 MG capsule Take 20 mg by mouth daily before breakfast.       . fexofenadine-pseudoephedrine (ALLEGRA-D) 60-120 MG per tablet Take 1 tablet by mouth as needed.      . hydrALAZINE (APRESOLINE) 25 MG tablet Take 25 mg by mouth as needed (uses for allergies).       . loratadine (  CLARITIN) 10 MG tablet Take 10 mg by mouth daily as needed for allergies.      . Multiple Vitamin (MULTI-VITAMIN PO) Take 1 tablet by mouth daily.       Marland Kitchen OVER THE COUNTER MEDICATION       . Probiotic Product (PROBIOTIC DAILY PO) Take 1 capsule by mouth daily.       Marland Kitchen triamcinolone (KENALOG) 0.025 % cream Apply 1 application topically as needed.       . vitamin B-12 (CYANOCOBALAMIN) 1000 MCG tablet Take 1,000 mcg by mouth daily.      Marland Kitchen oxyCODONE-acetaminophen (PERCOCET/ROXICET) 5-325 MG per tablet        No current facility-administered medications for this encounter.    REVIEW OF SYSTEMS:  A 15 point review of systems is documented in the electronic medical record. This was obtained by the nursing staff. However, I  reviewed this with the patient to discuss relevant findings and make appropriate changes.  Pertinent items are noted in HPI.   PHYSICAL EXAM:  Filed Vitals:   08/05/12 0924  BP: 157/91  Pulse: 66  Temp: 98.2 F (36.8 C)  Resp: 20  .178 lb 8 oz (80.967 kg). She is a pleasant female in no distress sitting comfortably examining table. She has a keloid in the upper outer quadrant of the right breast. She has no palpable axillary supraclavicular or cervical adenopathy. No palpable abnormalities of the right breast. She has surgical change and density in the inner lower quadrant of the left breast around her scar. She has some edema around the area left. She has no lymphedema. She has 5 out of 5 strength bilaterally. She is alert and oriented x3.  LABORATORY DATA:  Lab Results  Component Value Date   WBC 6.3 07/18/2012   HGB 12.5 07/18/2012   HCT 37.3 07/18/2012   MCV 77.7* 07/18/2012   PLT 272 07/18/2012   Lab Results  Component Value Date   NA 142 07/18/2012   K 3.8 07/18/2012   CL 107 07/18/2012   CO2 27 07/18/2012   Lab Results  Component Value Date   ALT 18 07/18/2012   AST 18 07/18/2012   ALKPHOS 95 07/18/2012   BILITOT 0.52 07/18/2012     RADIOGRAPHY: No results found.    IMPRESSION: Incidentally found DCIS of the left breast  PLAN: I spoke with the patient today regarding her diagnosis and options for treatment. She has a very small focus of low-grade DCIS with widely negative margins. Her tumor is also strongly estrogen receptor positive. She has no family history. This lesion was also seen on mammography. All of these factors contribute to basically an incidentally found DCIS. These lesions usually have an indolent course and a very low chance of tumor recurrence. Radiation would likely not helped that low chance of recurrence. I therefore discussed chemoprevention alone with her. We discussed that if DCIS did come back she would be eligible for reexcision and radiation at that time. We discussed  the very low likelihood that this would recur as invasive and metastatic disease. I emphasized to her the need for regular screening mammograms. She was actually somewhat relieved that she would not have to have radiation. She has followup with Dr. Welton Flakes in September. I encouraged her to call Dr. Welton Flakes sooner to receive her prescription for tamoxifen. She also has regularly scheduled followup with Dr. Ezzard Standing. I have not scheduled followup with her. I would be happy to see her back at any point in the  future and asked her to call me with any questions.  I spent 30 minutes  face to face with the patient and more than 50% of that time was spent in counseling and/or coordination of care.   ------------------------------------------------  Lurline Hare, MD

## 2012-08-09 ENCOUNTER — Telehealth: Payer: Self-pay | Admitting: *Deleted

## 2012-08-09 NOTE — Telephone Encounter (Signed)
sw pt gv appt d/t for 08/15/12 @12noon . Pt is aware...td

## 2012-08-15 ENCOUNTER — Ambulatory Visit (HOSPITAL_BASED_OUTPATIENT_CLINIC_OR_DEPARTMENT_OTHER): Payer: BC Managed Care – PPO | Admitting: Oncology

## 2012-08-15 ENCOUNTER — Encounter: Payer: Self-pay | Admitting: Oncology

## 2012-08-15 VITALS — BP 123/81 | HR 67 | Temp 98.3°F | Resp 20 | Ht 63.0 in | Wt 176.3 lb

## 2012-08-15 DIAGNOSIS — D0512 Intraductal carcinoma in situ of left breast: Secondary | ICD-10-CM

## 2012-08-15 DIAGNOSIS — D0590 Unspecified type of carcinoma in situ of unspecified breast: Secondary | ICD-10-CM

## 2012-08-15 MED ORDER — TAMOXIFEN CITRATE 20 MG PO TABS: 20.0000 mg | ORAL_TABLET | Freq: Every day | ORAL | Status: AC

## 2012-08-15 NOTE — Progress Notes (Signed)
OFFICE PROGRESS NOTE  CC  Gaye Alken, MD [address] [place] [address] Dr. Chevis Pretty Dr. 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  STAGE: DCIS (ductal carcinoma in situ) of breast, left.   Primary site: Breast (Left)   Staging method: AJCC 7th Edition   Pathologic: Stage 0 (Tis (DCIS), NX, cM0) signed by Victorino December, MD on 08/15/2012  3:48 PM   Summary: Stage 0 (Tis (DCIS), NX, cM0) ER positive, PR positive  PRIOR THERAPY:  #1 patient had a routine mammogram performed and was found to have an abnormality in the medial aspect of the left breast. Biopsy showed a complex sclerosing lesion and an excisional biopsy was recommended.  #2 06/21/2012 patient underwent a lumpectomy that showed a complex sclerosing lesion within 0.2 cm focus of low-grade ductal carcinoma in situ. This was a Center of the gross specimen measured 8.5 x 7.5 x 3.3 cm. Tumor was ER +100% PR +100%.  #3 patient has been seen by Dr. Lurline Hare who has deferred radiation due to small size.  #4 patient will begin chemoprophylaxis with tamoxifen 20 mg daily. We discussed the rationale for this. Risks and benefits of treatment were discussed with the patient. She does understand that this will reduce her risk of developing another DCIS or invasive disease on the ipsilateral as well as the contralateral breast. She will begin this treatment today 08/15/2012.  CURRENT THERAPY: Tamoxifen 20 mg daily beginning 08/15/2012  INTERVAL HISTORY: Tamara Brandt 53 y.o. female returns for followup visit. Her last visit with me was on 07/18/2012. Since then she has been seen by Dr. Lurline Hare. She was recommended not to do radiation but  to proceed with antiestrogen therapy only. Clinically patient seems to be doing well. She denies any fevers chills night sweats headaches shortness of breath chest pains palpitations no  nausea or vomiting no breast tenderness. Remainder of the 10 point review of systems is negative.  MEDICAL HISTORY: Past Medical History  Diagnosis Date  . Diverticulosis   . GERD (gastroesophageal reflux disease)   . Psoriasis   . Allergy   . Arthritis   . Seasonal allergies   . Wears glasses   . Heart murmur     dx many yr ago-never had an echo  . Anxiety     new dx    ALLERGIES:  is allergic to hydrocodone.  MEDICATIONS:  Current Outpatient Prescriptions  Medication Sig Dispense Refill  . ALOE VERA JUICE PO Take 120 mLs by mouth daily.       Marland Kitchen BIOTIN 5000 PO Take 5,000 tablets by mouth daily.       Marland Kitchen CALCIUM PO Take 600 tablets by mouth daily. With vit d      . cholecalciferol (VITAMIN D) 1000 UNITS tablet Take 1,000 Units by mouth daily.      Marland Kitchen esomeprazole (NEXIUM) 40 MG capsule Take 20 mg by mouth daily before breakfast.       . fexofenadine-pseudoephedrine (ALLEGRA-D) 60-120 MG per tablet Take 1 tablet by mouth as needed.      . hydrALAZINE (APRESOLINE) 25 MG tablet Take 25 mg by mouth as needed (uses for allergies).       . loratadine (CLARITIN) 10 MG tablet Take 10 mg by mouth daily as needed for allergies.      . Multiple Vitamin (MULTI-VITAMIN PO) Take 1 tablet by mouth daily.       Marland Kitchen  OVER THE COUNTER MEDICATION       . Probiotic Product (PROBIOTIC DAILY PO) Take 1 capsule by mouth daily.       Marland Kitchen triamcinolone (KENALOG) 0.025 % cream Apply 1 application topically as needed.       . vitamin B-12 (CYANOCOBALAMIN) 1000 MCG tablet Take 1,000 mcg by mouth daily.      Marland Kitchen oxyCODONE-acetaminophen (PERCOCET/ROXICET) 5-325 MG per tablet       . tamoxifen (NOLVADEX) 20 MG tablet Take 1 tablet (20 mg total) by mouth daily.  90 tablet  12   No current facility-administered medications for this visit.    SURGICAL HISTORY:  Past Surgical History  Procedure Laterality Date  . Cesarean section    . Breast lumpectomy  2008    excision rt br mass  . Breast lumpectomy  2002     excision lt br mass  . Colonoscopy      2004  . Breast biopsy Left 06/21/2012    Procedure: BREAST BIOPSY WITH NEEDLE LOCALIZATION;  Surgeon: Kandis Cocking, MD;  Location: [facility];  Service: General;  Laterality: Left;  . Abdominal hysterectomy  1991    ovaries intact    REVIEW OF SYSTEMS:  Pertinent items are noted in HPI.   HEALTH MAINTENANCE:   PHYSICAL EXAMINATION: Blood pressure 123/81, pulse 67, temperature 98.3 F (36.8 C), temperature source Oral, resp. rate 20, height 5\' 3"  (1.6 m), weight 176 lb 4.8 oz (79.969 kg). Body mass index is 31.24 kg/(m^2). ECOG PERFORMANCE STATUS: 0 - Asymptomatic   General appearance: alert, cooperative and appears stated age Resp: clear to auscultation bilaterally Cardio: regular rate and rhythm GI: soft, non-tender; bowel sounds normal; no masses,  no organomegaly Extremities: extremities normal, atraumatic, no cyanosis or edema Neurologic: Grossly normal  left breast examination feeling medial scar no masses nipple discharge or skin changes.   right breast: No masses or nipple discharge.  LABORATORY DATA: Lab Results  Component Value Date   WBC 6.3 07/18/2012   HGB 12.5 07/18/2012   HCT 37.3 07/18/2012   MCV 77.7* 07/18/2012   PLT 272 07/18/2012      Chemistry      Component Value Date/Time   NA 142 07/18/2012 1302   K 3.8 07/18/2012 1302   CL 107 07/18/2012 1302   CO2 27 07/18/2012 1302   BUN 8.2 07/18/2012 1302   CREATININE 0.8 07/18/2012 1302      Component Value Date/Time   CALCIUM 9.8 07/18/2012 1302   ALKPHOS 95 07/18/2012 1302   AST 18 07/18/2012 1302   ALT 18 07/18/2012 1302   BILITOT 0.52 07/18/2012 1302     PATHOLOGY:  ADDITIONAL INFORMATION:  PROGNOSTIC INDICATORS - ACIS  Results:  IMMUNOHISTOCHEMICAL AND MORPHOMETRIC ANALYSIS BY THE AUTOMATED CELLULAR  IMAGING SYSTEM (ACIS)  Estrogen Receptor: 100%, POSITIVE, STRONG STAINING INTENSITY  Progesterone Receptor: 100%, POSITIVE, STRONG STAINING INTENSITY  REFERENCE  RANGE  ESTROGEN RECEPTOR  NEGATIVE <1%  POSITIVE =>1%  PROGESTERONE RECEPTOR  NEGATIVE <1%  POSITIVE =>1%  All controls stained appropriately  Pecola Leisure MD  Pathologist, Electronic Signature  ( Signed 06/29/2012)  FINAL DIAGNOSIS  Diagnosis  Breast, lumpectomy, Left  - COMPLEX SCLEROSING LESION WITH A 0.2 CM FOCUS OF LOW GRADE DUCTAL CARCINOMA IN  SITU  1 of 3  FINAL for Tamara Brandt, Tamara Brandt [phone])  Diagnosis(continued)  WITH ASSOCIATED CALCIFICATION.  - MARGINS ARE NEGATIVE.  - SEE ONCOLOGY TEMPLATE.  ADDITIONAL FINDINGS:  - FIBROCYSTIC CHANGES WITH USUAL DUCTAL HYPERPLASIA.  Microscopic Comment  BREAST, IN SITU CARCINOMA  Specimen, including laterality: Left partial breast.  Procedure: Left breast lumpectomy.  Grade of carcinoma: Low grade.  Necrosis: No.  Estimated tumor size: (glass slide measurement): 0.2 cm.  Treatment effect: Not applicable.  Distance to closest margin: At least 1 cm (inferior margin).  Breast prognostic profile: See below comments.  Lymph nodes: No lymph nodes received.  TNM: pTis, pNX, MX.  Comments: A quantitative estrogen receptor and progesterone receptor will be performed on the focus of low  grade ductal carcinoma in situ and reported in an addendum. Dr. Luisa Hart has seen the focus of low grade  ductal carcinoma in situ (slide 1D) in consultation with agreement. (RAH:caf 06/23/12)      RADIOGRAPHIC STUDIES:  No results found.  ASSESSMENT:  52 year old female with  #1 diagnosis of left ductal carcinoma in situ diagnosed on a screening mammogram presented with an abnormality in the left breast she underwent a lumpectomy a complex sclerosing lesion with a 0.2 cm focus of ductal carcinoma in situ that was low grade, ER +100% PR +100%. Patient postoperatively is doing well.  2 she was seen by Dr. Lurline Hare who has recommended against radiation therapy.  #3 therefore patient will proceed with antiestrogen therapy with tamoxifen 20  mg daily potential for future breast cancer this. We discussed the rationale, risks, benefits of therapy. She understands the risks which include but are not limited to accelerated cataracts clots strokes weight gain hot flashes, uterine cancer.   PLAN:   #1 patient will begin tamoxifen 20 mg starting today 08/15/2012.  #2 she will return in 3 months time for followup.   All questions were answered. The patient knows to call the clinic with any problems, questions or concerns. We can certainly see the patient much sooner if necessary.  I spent 25 minutes counseling the patient face to face. The total time spent in the appointment was 30 minutes.    Drue Second, MD Medical/Oncology [facility] [phone] (beeper) [phone] (Office)  08/15/2012, 3:46 PM

## 2012-08-15 NOTE — Patient Instructions (Addendum)
Proceed with tamoxifen 20 mg daily  We will see you back in 3 months for follow up  Tamoxifen oral tablet What is this medicine? TAMOXIFEN (ta MOX i fen) blocks the effects of estrogen. It is commonly used to treat breast cancer. It is also used to decrease the chance of breast cancer coming back in women who have received treatment for the disease. It may also help prevent breast cancer in women who have a high risk of developing breast cancer. This medicine may be used for other purposes; ask your health care provider or pharmacist if you have questions. What should I tell my health care provider before I take this medicine? They need to know if you have any of these conditions: -blood clots -blood disease -cataracts or impaired eyesight -endometriosis -high calcium levels -high cholesterol -irregular menstrual cycles -liver disease -stroke -uterine fibroids -an unusual or allergic reaction to tamoxifen, other medicines, foods, dyes, or preservatives -pregnant or trying to get pregnant -breast-feeding How should I use this medicine? Take this medicine by mouth with a glass of water. Follow the directions on the prescription label. You can take it with or without food. Take your medicine at regular intervals. Do not take your medicine more often than directed. Do not stop taking except on your doctor's advice. A special MedGuide will be given to you by the pharmacist with each prescription and refill. Be sure to read this information carefully each time. Talk to your pediatrician regarding the use of this medicine in children. While this drug may be prescribed for selected conditions, precautions do apply. Overdosage: If you think you have taken too much of this medicine contact a poison control center or emergency room at once. NOTE: This medicine is only for you. Do not share this medicine with others. What if I miss a dose? If you miss a dose, take it as soon as you can. If it is  almost time for your next dose, take only that dose. Do not take double or extra doses. What may interact with this medicine? -aminoglutethimide -bromocriptine -chemotherapy drugs -female hormones, like estrogens and birth control pills -letrozole -medroxyprogesterone -phenobarbital -rifampin -warfarin This list may not describe all possible interactions. Give your health care provider a list of all the medicines, herbs, non-prescription drugs, or dietary supplements you use. Also tell them if you smoke, drink alcohol, or use illegal drugs. Some items may interact with your medicine. What should I watch for while using this medicine? Visit your doctor or health care professional for regular checks on your progress. You will need regular pelvic exams, breast exams, and mammograms. If you are taking this medicine to reduce your risk of getting breast cancer, you should know that this medicine does not prevent all types of breast cancer. If breast cancer or other problems occur, there is no guarantee that it will be found at an early stage. Do not become pregnant while taking this medicine or for 2 months after stopping this medicine. Stop taking this medicine if you get pregnant or think you are pregnant and contact your doctor. This medicine may harm your unborn baby. Women who can possibly become pregnant should use birth control methods that do not use hormones during tamoxifen treatment and for 2 months after therapy has stopped. Talk with your health care provider for birth control advice. Do not breast feed while taking this medicine. What side effects may I notice from receiving this medicine? Side effects that you should report to your  doctor or health care professional as soon as possible: -changes in vision (blurred vision) -changes in your menstrual cycle -difficulty breathing or shortness of breath -difficulty walking or talking -new breast lumps -numbness -pelvic pain or  pressure -redness, blistering, peeling or loosening of the skin, including inside the mouth -skin rash or itching (hives) -sudden chest pain -swelling of lips, face, or tongue -swelling, pain or tenderness in your calf or leg -unusual bruising or bleeding -vaginal discharge that is bloody, brown, or rust -weakness -yellowing of the whites of the eyes or skin Side effects that usually do not require medical attention (report to your doctor or health care professional if they continue or are bothersome): -fatigue -hair loss, although uncommon and is usually mild -headache -hot flashes -impotence (in men) -nausea, vomiting (mild) -vaginal discharge (white or clear) This list may not describe all possible side effects. Call your doctor for medical advice about side effects. You may report side effects to FDA at 1-800-FDA-1088. Where should I keep my medicine? Keep out of the reach of children. Store at room temperature between 20 and 25 degrees C (68 and 77 degrees F). Protect from light. Keep container tightly closed. Throw away any unused medicine after the expiration date. NOTE: This sheet is a summary. It may not cover all possible information. If you have questions about this medicine, talk to your doctor, pharmacist, or health care provider.  2013, [employer]. (10/13/2007 12:01:56 PM)

## 2012-08-16 ENCOUNTER — Telehealth: Payer: Self-pay | Admitting: *Deleted

## 2012-08-16 NOTE — Telephone Encounter (Signed)
sw pt gv appt d/t for 11/18/12 w/ labs @2 :15pm and ov@ 2:45pm. Pt is aware that i will mail out a letter/avs as well...td

## 2012-08-20 ENCOUNTER — Encounter: Payer: Self-pay | Admitting: *Deleted

## 2012-08-20 NOTE — Progress Notes (Signed)
Mailed after appt letter to pt. 

## 2012-09-16 ENCOUNTER — Ambulatory Visit (INDEPENDENT_AMBULATORY_CARE_PROVIDER_SITE_OTHER): Payer: BC Managed Care – PPO | Admitting: Obstetrics and Gynecology

## 2012-09-16 ENCOUNTER — Encounter: Payer: Self-pay | Admitting: Obstetrics and Gynecology

## 2012-09-16 VITALS — BP 124/76 | HR 60 | Ht 64.0 in | Wt 178.5 lb

## 2012-09-16 DIAGNOSIS — R3129 Other microscopic hematuria: Secondary | ICD-10-CM

## 2012-09-16 DIAGNOSIS — Z23 Encounter for immunization: Secondary | ICD-10-CM

## 2012-09-16 DIAGNOSIS — Z0000 Encounter for general adult medical examination without abnormal findings: Secondary | ICD-10-CM

## 2012-09-16 DIAGNOSIS — Z01419 Encounter for gynecological examination (general) (routine) without abnormal findings: Secondary | ICD-10-CM

## 2012-09-16 LAB — POCT URINALYSIS DIPSTICK
Bilirubin, UA: NEGATIVE
Glucose, UA: NEGATIVE
Ketones, UA: NEGATIVE
Leukocytes, UA: NEGATIVE
Nitrite, UA: NEGATIVE
pH, UA: 5

## 2012-09-16 NOTE — Patient Instructions (Signed)
EXERCISE AND DIET:  We recommended that you start or continue a regular exercise program for good health. Regular exercise means any activity that makes your heart beat faster and makes you sweat.  We recommend exercising at least 30 minutes per day at least 3 days a week, preferably 4 or 5.  We also recommend a diet low in fat and sugar.  Inactivity, poor dietary choices and obesity can cause diabetes, heart attack, stroke, and kidney damage, among others.    ALCOHOL AND SMOKING:  Women should limit their alcohol intake to no more than 7 drinks/beers/glasses of wine (combined, not each!) per week. Moderation of alcohol intake to this level decreases your risk of breast cancer and liver damage. And of course, no recreational drugs are part of a healthy lifestyle.  And absolutely no smoking or even second hand smoke. Most people know smoking can cause heart and lung diseases, but did you know it also contributes to weakening of your bones? Aging of your skin?  Yellowing of your teeth and nails?  CALCIUM AND VITAMIN D:  Adequate intake of calcium and Vitamin D are recommended.  The recommendations for exact amounts of these supplements seem to change often, but generally speaking 600 mg of calcium (either carbonate or citrate) and 800 units of Vitamin D per day seems prudent. Certain women may benefit from higher intake of Vitamin D.  If you are among these women, your doctor will have told you during your visit.    PAP SMEARS:  Pap smears, to check for cervical cancer or precancers,  have traditionally been done yearly, although recent scientific advances have shown that most women can have pap smears less often.  However, every woman still should have a physical exam from her gynecologist every year. It will include a breast check, inspection of the vulva and vagina to check for abnormal growths or skin changes, a visual exam of the cervix, and then an exam to evaluate the size and shape of the uterus and  ovaries.  And after 54 years of age, a rectal exam is indicated to check for rectal cancers. We will also provide age appropriate advice regarding health maintenance, like when you should have certain vaccines, screening for sexually transmitted diseases, bone density testing, colonoscopy, mammograms, etc.   MAMMOGRAMS:  All women over 40 years old should have a yearly mammogram. Many facilities now offer a "3D" mammogram, which may cost around $50 extra out of pocket. If possible,  we recommend you accept the option to have the 3D mammogram performed.  It both reduces the number of women who will be called back for extra views which then turn out to be normal, and it is better than the routine mammogram at detecting truly abnormal areas.    COLONOSCOPY:  Colonoscopy to screen for colon cancer is recommended for all women at age 50.  We know, you hate the idea of the prep.  We agree, BUT, having colon cancer and not knowing it is worse!!  Colon cancer so often starts as a polyp that can be seen and removed at colonscopy, which can quite literally save your life!  And if your first colonoscopy is normal and you have no family history of colon cancer, most women don't have to have it again for 10 years.  Once every ten years, you can do something that may end up saving your life, right?  We will be happy to help you get it scheduled when you are ready.    Be sure to check your insurance coverage so you understand how much it will cost.  It may be covered as a preventative service at no cost, but you should check your particular policy.    Tetanus, Diphtheria, Pertussis (Tdap) Vaccine What You Need to Know WHY GET VACCINATED? Tetanus, diphtheria and pertussis can be very serious diseases, even for adolescents and adults. Tdap vaccine can protect us from these diseases. TETANUS (Lockjaw) causes painful muscle tightening and stiffness, usually all over the body.  It can lead to tightening of muscles in the head  and neck so you can't open your mouth, swallow, or sometimes even breathe. Tetanus kills about 1 out of 5 people who are infected. DIPHTHERIA can cause a thick coating to form in the back of the throat.  It can lead to breathing problems, paralysis, heart failure, and death. PERTUSSIS (Whooping Cough) causes severe coughing spells, which can cause difficulty breathing, vomiting and disturbed sleep.  It can also lead to weight loss, incontinence, and rib fractures. Up to 2 in 100 adolescents and 5 in 100 adults with pertussis are hospitalized or have complications, which could include pneumonia and death. These diseases are caused by bacteria. Diphtheria and pertussis are spread from person to person through coughing or sneezing. Tetanus enters the body through cuts, scratches, or wounds. Before vaccines, the [place] saw as many as 200,000 cases a year of diphtheria and pertussis, and hundreds of cases of tetanus. Since vaccination began, tetanus and diphtheria have dropped by about 99% and pertussis by about 80%. TDAP VACCINE Tdap vaccine can protect adolescents and adults from tetanus, diphtheria, and pertussis. One dose of Tdap is routinely given at age 11 or 12. People who did not get Tdap at that age should get it as soon as possible. Tdap is especially important for health care professionals and anyone having close contact with a baby younger than 12 months. Pregnant women should get a dose of Tdap during every pregnancy, to protect the newborn from pertussis. Infants are most at risk for severe, life-threatening complications from pertussis. A similar vaccine, called Td, protects from tetanus and diphtheria, but not pertussis. A Td booster should be given every 10 years. Tdap may be given as one of these boosters if you have not already gotten a dose. Tdap may also be given after a severe cut or burn to prevent tetanus infection. Your doctor can give you more information. Tdap may safely  be given at the same time as other vaccines. SOME PEOPLE SHOULD NOT GET THIS VACCINE  If you ever had a life-threatening allergic reaction after a dose of any tetanus, diphtheria, or pertussis containing vaccine, OR if you have a severe allergy to any part of this vaccine, you should not get Tdap. Tell your doctor if you have any severe allergies.  If you had a coma, or long or multiple seizures within 7 days after a childhood dose of DTP or DTaP, you should not get Tdap, unless a cause other than the vaccine was found. You can still get Td.  Talk to your doctor if you:  have epilepsy or another nervous system problem,  had severe pain or swelling after any vaccine containing diphtheria, tetanus or pertussis,  ever had Guillain-Barr Syndrome (GBS),  aren't feeling well on the day the shot is scheduled. RISKS OF A VACCINE REACTION With any medicine, including vaccines, there is a chance of side effects. These are usually mild and go away on their own, but serious   reactions are also possible. Brief fainting spells can follow a vaccination, leading to injuries from falling. Sitting or lying down for about 15 minutes can help prevent these. Tell your doctor if you feel dizzy or light-headed, or have vision changes or ringing in the ears. Mild problems following Tdap (Did not interfere with activities)  Pain where the shot was given (about 3 in 4 adolescents or 2 in 3 adults)  Redness or swelling where the shot was given (about 1 person in 5)  Mild fever of at least 100.4F (up to about 1 in 25 adolescents or 1 in 100 adults)  Headache (about 3 or 4 people in 10)  Tiredness (about 1 person in 3 or 4)  Nausea, vomiting, diarrhea, stomach ache (up to 1 in 4 adolescents or 1 in 10 adults)  Chills, body aches, sore joints, rash, swollen glands (uncommon) Moderate problems following Tdap (Interfered with activities, but did not require medical attention)  Pain where the shot was given  (about 1 in 5 adolescents or 1 in 100 adults)  Redness or swelling where the shot was given (up to about 1 in 16 adolescents or 1 in 25 adults)  Fever over 102F (about 1 in 100 adolescents or 1 in 250 adults)  Headache (about 3 in 20 adolescents or 1 in 10 adults)  Nausea, vomiting, diarrhea, stomach ache (up to 1 or 3 people in 100)  Swelling of the entire arm where the shot was given (up to about 3 in 100). Severe problems following Tdap (Unable to perform usual activities, required medical attention)  Swelling, severe pain, bleeding and redness in the arm where the shot was given (rare). A severe allergic reaction could occur after any vaccine (estimated less than 1 in a million doses). WHAT IF THERE IS A SERIOUS REACTION? What should I look for?  Look for anything that concerns you, such as signs of a severe allergic reaction, very high fever, or behavior changes. Signs of a severe allergic reaction can include hives, swelling of the face and throat, difficulty breathing, a fast heartbeat, dizziness, and weakness. These would start a few minutes to a few hours after the vaccination. What should I do?  If you think it is a severe allergic reaction or other emergency that can't wait, call 9-1-1 or get the person to the nearest hospital. Otherwise, call your doctor.  Afterward, the reaction should be reported to the "Vaccine Adverse Event Reporting System" (VAERS). Your doctor might file this report, or you can do it yourself through the VAERS web site at www.vaers.hhs.gov, or by calling 1-800-822-7967. VAERS is only for reporting reactions. They do not give medical advice.  THE NATIONAL VACCINE INJURY COMPENSATION PROGRAM The National Vaccine Injury Compensation Program (VICP) is a federal program that was created to compensate people who may have been injured by certain vaccines. Persons who believe they may have been injured by a vaccine can learn about the program and about filing a  claim by calling 1-800-338-2382 or visiting the VICP website at www.hrsa.gov/vaccinecompensation. HOW CAN I LEARN MORE?  Ask your doctor.  Call your local or state health department.  Contact the Centers for Disease Control and Prevention (CDC):  Call 1-800-232-4636 or visit CDC's website at www.cdc.gov/vaccines. CDC Tdap Vaccine VIS (06/18/11) Document Released: 07/28/2011 Document Revised: 10/21/2011 Document Reviewed: 07/28/2011 [employer] Patient Information 2014 [employer].  

## 2012-09-16 NOTE — Progress Notes (Signed)
Patient ID: Tamara Brandt, female   DOB: 1960/03/05, 52 y.o.   MRN: [id number] 52 y.o.   Married    [place]   female   [id number]   here for annual exam.    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   Some joint pain.  Some warm flashes, which is manageable. Sees Dr. Welton Flakes oncology and Dr Jamey Ripa as Careers adviser. Status post left lumpectomy. No radiation.   Has UTIs once every couple of years.     Having an increase in discharge since starting Tamoxifen.  Patient's last menstrual period was 02/09/1989.          Sexually active: yes  The current method of family planning is status post hysterectomy 1991 for fibroids and bleeding.  Uncertain if has a cervix still or not.   Still has ovaries. Exercising: walking Last mammogram:  04/2012 and diagnosed with breast cancer left breast--had lumpectomy Last pap smear: 2012 wnl History of abnormal pap: hx abnormal pap with colposcopy 1991, no treatment to cervix--followed with repeat paps and pap smears reverted back to normal. Smoking: no Alcohol: 3-4 glasses of wine per week Last colonoscopy: 05-11-12 wnl: [facility]--next colonoscopy due 2024.  Had endoscopy at the same time.   Last Bone Density:  never Last tetanus shot: over 10 years Last cholesterol check:  Unsure, thinks it was a little high when it was last done.   Hgb:                Urine: trace RBCs. (no dysuria, no urgency, up at night once a night)   Family History  Problem Relation Age of Onset  . Diabetes Father   . Hypertension Father   . Heart disease Paternal Aunt   . Heart disease Mother   . Rheum arthritis Mother   . Arthritis Mother   . Hypertension Maternal Grandmother   . Stroke Maternal Grandmother     Patient Active Problem List   Diagnosis Date Noted  . DCIS (ductal carcinoma in situ) of breast, left. 06/29/2012    Past Medical History  Diagnosis Date  . Diverticulosis   . GERD (gastroesophageal reflux disease)   . Psoriasis   . Allergy    . Arthritis   . Seasonal allergies   . Wears glasses   . Heart murmur     dx many yr ago-never had an echo  . Anxiety     new dx  . Breast cancer 04/2012    left breast    Past Surgical History  Procedure Laterality Date  . Cesarean section    . Colonoscopy      2004  . Abdominal hysterectomy  1991    TAH--still has ovaries  . Breast lumpectomy  2008    excision rt br mass  . Breast lumpectomy  2002    excision lt br mass  . Breast biopsy Left 06/21/2012    Procedure: BREAST BIOPSY WITH NEEDLE LOCALIZATION;  Surgeon: Kandis Cocking, MD;  Location: [facility];  Service: General;  Laterality: Left;    Allergies: Hydrocodone  Current Outpatient Prescriptions  Medication Sig Dispense Refill  . ALOE VERA JUICE PO Take 120 mLs by mouth daily.       Marland Kitchen BIOTIN 5000 PO Take 5,000 tablets by mouth daily.       Marland Kitchen CALCIUM PO Take 600 tablets by mouth daily. With vit d      . cholecalciferol (VITAMIN D) 1000 UNITS tablet  Take 1,000 Units by mouth daily.      Marland Kitchen esomeprazole (NEXIUM) 40 MG capsule Take 20 mg by mouth daily before breakfast.       . fexofenadine-pseudoephedrine (ALLEGRA-D) 60-120 MG per tablet Take 1 tablet by mouth as needed.      . hydrALAZINE (APRESOLINE) 25 MG tablet Take 25 mg by mouth as needed (uses for allergies).       . loratadine (CLARITIN) 10 MG tablet Take 10 mg by mouth daily as needed for allergies.      . Multiple Vitamin (MULTI-VITAMIN PO) Take 1 tablet by mouth daily.       Marland Kitchen OVER THE COUNTER MEDICATION       . Probiotic Product (PROBIOTIC DAILY PO) Take 1 capsule by mouth daily.       . tamoxifen (NOLVADEX) 20 MG tablet Take 1 tablet by mouth daily.      Marland Kitchen triamcinolone (KENALOG) 0.025 % cream Apply 1 application topically as needed.       . vitamin B-12 (CYANOCOBALAMIN) 1000 MCG tablet Take 1,000 mcg by mouth daily.      Marland Kitchen oxyCODONE-acetaminophen (PERCOCET/ROXICET) 5-325 MG per tablet        No current facility-administered medications  for this visit.    ROS: Pertinent items are noted in HPI.  Social Hx:    Exam:    BP 124/76  Pulse 60  Ht 5\' 4"  (1.626 m)  Wt 178 lb 8 oz (80.967 kg)  BMI 30.62 kg/m2  LMP 02/09/1989   Wt Readings from Last 3 Encounters:  09/16/12 178 lb 8 oz (80.967 kg)  08/15/12 176 lb 4.8 oz (79.969 kg)  08/05/12 178 lb 8 oz (80.967 kg)     Ht Readings from Last 3 Encounters:  09/16/12 5\' 4"  (1.626 m)  08/15/12 5\' 3"  (1.6 m)  08/05/12 5\' 3"  (1.6 m)    General appearance: alert, cooperative and appears stated age Head: Normocephalic, without obvious abnormality, atraumatic Neck: no adenopathy, supple, symmetrical, trachea midline and thyroid not enlarged, symmetric, no tenderness/mass/nodules Lungs: clear to auscultation bilaterally Breast: right superior keloid at 2 o'clock, left areolar and 9 o'clock (With firmness and retraction) incisions,  No nipple retraction or dimpling, No nipple discharge or bleeding, No axillary or supraclavicular adenopathy, Normal to palpation without dominant masses Heart: regular rate and rhythm Abdomen: Vertical midline and Pfannenstiel incisions, soft, non-tender; bowel sounds normal; no masses,  no organomegaly Extremities: extremities normal, atraumatic, no cyanosis or edema Skin: Skin color, texture, turgor normal. No rashes or lesions Lymph nodes: Cervical, supraclavicular, and axillary nodes normal. No abnormal inguinal nodes palpated Neurologic: Grossly normal   Pelvic: External genitalia:  no lesions              Urethra:  normal appearing urethra with no masses, tenderness or lesions              Bartholins and Skenes: normal                 Vagina: normal appearing vagina with normal color and discharge, no lesions              Cervix: normal appearance              Pap taken: no        Bimanual Exam:  Uterus:   absent  Adnexa: normal adnexa in size, nontender and no masses                                       Rectovaginal: Confirms                                      Anus:  normal sphincter tone, no lesions  A: normal menopausal exam Microscopic hematuria.  No evaluation needed. Left breast DCIS.  On Tamoxifen. Status post total abdominal hysterectomy.      P:     Mammogram per Dr. Park Breed and Dr. Jamey Ripa pap smear no indicated. TDap. return annually or prn     An After Visit Summary was printed and given to the patient.

## 2012-09-29 ENCOUNTER — Telehealth: Payer: Self-pay | Admitting: Oncology

## 2012-09-29 NOTE — Telephone Encounter (Signed)
, °

## 2012-10-21 ENCOUNTER — Ambulatory Visit: Payer: BC Managed Care – PPO | Admitting: Oncology

## 2012-11-14 ENCOUNTER — Telehealth: Payer: Self-pay | Admitting: Oncology

## 2012-11-15 NOTE — Telephone Encounter (Signed)
error 

## 2012-11-18 ENCOUNTER — Other Ambulatory Visit: Payer: BC Managed Care – PPO | Admitting: Lab

## 2012-11-18 ENCOUNTER — Ambulatory Visit: Payer: BC Managed Care – PPO | Admitting: Oncology

## 2012-12-12 ENCOUNTER — Telehealth: Payer: Self-pay | Admitting: *Deleted

## 2012-12-12 ENCOUNTER — Other Ambulatory Visit (HOSPITAL_BASED_OUTPATIENT_CLINIC_OR_DEPARTMENT_OTHER): Payer: BC Managed Care – PPO | Admitting: Lab

## 2012-12-12 ENCOUNTER — Ambulatory Visit (HOSPITAL_BASED_OUTPATIENT_CLINIC_OR_DEPARTMENT_OTHER): Payer: BC Managed Care – PPO | Admitting: Oncology

## 2012-12-12 ENCOUNTER — Encounter: Payer: Self-pay | Admitting: Oncology

## 2012-12-12 VITALS — BP 150/86 | HR 57 | Temp 98.6°F | Resp 20 | Ht 64.0 in | Wt 185.9 lb

## 2012-12-12 DIAGNOSIS — Z170 Estrogen receptor positive status [ER+]: Secondary | ICD-10-CM

## 2012-12-12 DIAGNOSIS — D0590 Unspecified type of carcinoma in situ of unspecified breast: Secondary | ICD-10-CM

## 2012-12-12 DIAGNOSIS — D0512 Intraductal carcinoma in situ of left breast: Secondary | ICD-10-CM

## 2012-12-12 DIAGNOSIS — C50912 Malignant neoplasm of unspecified site of left female breast: Secondary | ICD-10-CM

## 2012-12-12 LAB — COMPREHENSIVE METABOLIC PANEL (CC13)
AST: 18 U/L (ref 5–34)
Albumin: 3.7 g/dL (ref 3.5–5.0)
Anion Gap: 9 mEq/L (ref 3–11)
BUN: 8.9 mg/dL (ref 7.0–26.0)
CO2: 25 mEq/L (ref 22–29)
Calcium: 9.5 mg/dL (ref 8.4–10.4)
Chloride: 107 mEq/L (ref 98–109)
Creatinine: 0.6 mg/dL (ref 0.6–1.1)
Glucose: 81 mg/dl (ref 70–140)
Potassium: 3.9 mEq/L (ref 3.5–5.1)

## 2012-12-12 LAB — CBC WITH DIFFERENTIAL/PLATELET
Basophils Absolute: 0.1 10*3/uL (ref 0.0–0.1)
EOS%: 3.9 % (ref 0.0–7.0)
Eosinophils Absolute: 0.2 10*3/uL (ref 0.0–0.5)
HCT: 35.7 % (ref 34.8–46.6)
HGB: 11.6 g/dL (ref 11.6–15.9)
MCH: 26.1 pg (ref 25.1–34.0)
MONO#: 0.5 10*3/uL (ref 0.1–0.9)
NEUT#: 2.3 10*3/uL (ref 1.5–6.5)
NEUT%: 44.3 % (ref 38.4–76.8)
RDW: 12.4 % (ref 11.2–14.5)
lymph#: 2.1 10*3/uL (ref 0.9–3.3)

## 2012-12-12 NOTE — Progress Notes (Signed)
OFFICE PROGRESS NOTE  CC  Tamara Alken, MD [address] [place] [address] Dr. Chevis Pretty Dr. 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  STAGE: DCIS (ductal carcinoma in situ) of breast, left.   Primary site: Breast (Left)   Staging method: AJCC 7th Edition   Pathologic: Stage 0 (Tis (DCIS), NX, cM0) signed by Victorino December, MD on 08/15/2012  3:48 PM   Summary: Stage 0 (Tis (DCIS), NX, cM0) ER positive, PR positive  PRIOR THERAPY:  #1 patient had a routine mammogram performed and was found to have an abnormality in the medial aspect of the left breast. Biopsy showed a complex sclerosing lesion and an excisional biopsy was recommended.  #2 06/21/2012 patient underwent a lumpectomy that showed a complex sclerosing lesion within 0.2 cm focus of low-grade ductal carcinoma in situ. This was a Center of the gross specimen measured 8.5 x 7.5 x 3.3 cm. Tumor was ER +100% PR +100%.  #3 patient has been seen by Dr. Lurline Hare who has deferred radiation due to small size.  #4 patient will begin chemoprophylaxis with tamoxifen 20 mg daily. We discussed the rationale for this. Risks and benefits of treatment were discussed with the patient. She does understand that this will reduce her risk of developing another DCIS or invasive disease on the ipsilateral as well as the contralateral breast. She will begin this treatment today 08/15/2012.  CURRENT THERAPY: Tamoxifen 20 mg daily beginning 08/15/2012  INTERVAL HISTORY: Tamara Brandt 52 y.o. female returns for followup visit. She has been on tamoxifen now for 3 months. She is beginning to feel better. Her foot pain has improved. She has minimal side effects in terms of hot flashes. She did have some yellow vaginal discharge. She denies any headaches double vision blurring of vision no fevers chills or night sweats. No peripheral paresthesias.  Remainder of the 10 point review of systems is negative.  MEDICAL HISTORY: Past Medical History  Diagnosis Date  . Diverticulosis   . GERD (gastroesophageal reflux disease)   . Psoriasis   . Allergy   . Arthritis   . Seasonal allergies   . Wears glasses   . Heart murmur     dx many yr ago-never had an echo  . Anxiety     new dx  . Breast cancer 04/2012    left breast    ALLERGIES:  is allergic to hydrocodone.  MEDICATIONS:  Current Outpatient Prescriptions  Medication Sig Dispense Refill  . ALOE VERA JUICE PO Take 120 mLs by mouth daily.       Marland Kitchen BIOTIN 5000 PO Take 5,000 tablets by mouth daily.       Marland Kitchen CALCIUM PO Take 600 tablets by mouth daily. With vit d      . cholecalciferol (VITAMIN D) 1000 UNITS tablet Take 1,000 Units by mouth daily.      Marland Kitchen esomeprazole (NEXIUM) 40 MG capsule Take 20 mg by mouth daily before breakfast.       . Multiple Vitamin (MULTI-VITAMIN PO) Take 1 tablet by mouth daily.       Marland Kitchen OVER THE COUNTER MEDICATION       . Probiotic Product (PROBIOTIC DAILY PO) Take 1 capsule by mouth daily.       . tamoxifen (NOLVADEX) 20 MG tablet Take 1 tablet by mouth daily.      . vitamin B-12 (CYANOCOBALAMIN) 1000 MCG tablet Take 1,000 mcg by  mouth daily.      . fexofenadine-pseudoephedrine (ALLEGRA-D) 60-120 MG per tablet Take 1 tablet by mouth as needed.      . hydrALAZINE (APRESOLINE) 25 MG tablet Take 25 mg by mouth as needed (uses for allergies).       . loratadine (CLARITIN) 10 MG tablet Take 10 mg by mouth daily as needed for allergies.      Marland Kitchen triamcinolone (KENALOG) 0.025 % cream Apply 1 application topically as needed.        No current facility-administered medications for this visit.    SURGICAL HISTORY:  Past Surgical History  Procedure Laterality Date  . Cesarean section    . Colonoscopy      2004  . Abdominal hysterectomy  1991    TAH--still has ovaries  . Breast lumpectomy  2008    excision rt br mass  . Breast lumpectomy  2002    excision  lt br mass  . Breast biopsy Left 06/21/2012    Procedure: BREAST BIOPSY WITH NEEDLE LOCALIZATION;  Surgeon: Kandis Cocking, MD;  Location: [facility];  Service: General;  Laterality: Left;    REVIEW OF SYSTEMS:  Pertinent items are noted in HPI.   HEALTH MAINTENANCE:   PHYSICAL EXAMINATION: Blood pressure 150/86, pulse 57, temperature 98.6 F (37 C), temperature source Oral, resp. rate 20, height 5\' 4"  (1.626 m), weight 185 lb 14.4 oz (84.324 kg), last menstrual period 02/09/1989. Body mass index is 31.89 kg/(m^2). ECOG PERFORMANCE STATUS: 0 - Asymptomatic   General appearance: alert, cooperative and appears stated age Resp: clear to auscultation bilaterally Cardio: regular rate and rhythm GI: soft, non-tender; bowel sounds normal; no masses,  no organomegaly Extremities: extremities normal, atraumatic, no cyanosis or edema Neurologic: Grossly normal  left breast examination feeling medial scar no masses nipple discharge or skin changes.   right breast: No masses or nipple discharge.  LABORATORY DATA: Lab Results  Component Value Date   WBC 5.3 12/12/2012   HGB 11.6 12/12/2012   HCT 35.7 12/12/2012   MCV 80.2 12/12/2012   PLT 233 12/12/2012      Chemistry      Component Value Date/Time   NA 142 12/12/2012 1208   K 3.9 12/12/2012 1208   CL 107 07/18/2012 1302   CO2 25 12/12/2012 1208   BUN 8.9 12/12/2012 1208   CREATININE 0.6 12/12/2012 1208      Component Value Date/Time   CALCIUM 9.5 12/12/2012 1208   ALKPHOS 64 12/12/2012 1208   AST 18 12/12/2012 1208   ALT 12 12/12/2012 1208   BILITOT 0.41 12/12/2012 1208     PATHOLOGY:  ADDITIONAL INFORMATION:  PROGNOSTIC INDICATORS - ACIS  Results:  IMMUNOHISTOCHEMICAL AND MORPHOMETRIC ANALYSIS BY THE AUTOMATED CELLULAR  IMAGING SYSTEM (ACIS)  Estrogen Receptor: 100%, POSITIVE, STRONG STAINING INTENSITY  Progesterone Receptor: 100%, POSITIVE, STRONG STAINING INTENSITY  REFERENCE RANGE  ESTROGEN RECEPTOR  NEGATIVE <1%   POSITIVE =>1%  PROGESTERONE RECEPTOR  NEGATIVE <1%  POSITIVE =>1%  All controls stained appropriately  Pecola Leisure MD  Pathologist, Electronic Signature  ( Signed 06/29/2012)  FINAL DIAGNOSIS  Diagnosis  Breast, lumpectomy, Left  - COMPLEX SCLEROSING LESION WITH A 0.2 CM FOCUS OF LOW GRADE DUCTAL CARCINOMA IN  SITU  1 of 3  FINAL for Kayenta, Alexee [phone])  Diagnosis(continued)  WITH ASSOCIATED CALCIFICATION.  - MARGINS ARE NEGATIVE.  - SEE ONCOLOGY TEMPLATE.  ADDITIONAL FINDINGS:  - FIBROCYSTIC CHANGES WITH USUAL DUCTAL HYPERPLASIA.  Microscopic Comment  BREAST, IN  SITU CARCINOMA  Specimen, including laterality: Left partial breast.  Procedure: Left breast lumpectomy.  Grade of carcinoma: Low grade.  Necrosis: No.  Estimated tumor size: (glass slide measurement): 0.2 cm.  Treatment effect: Not applicable.  Distance to closest margin: At least 1 cm (inferior margin).  Breast prognostic profile: See below comments.  Lymph nodes: No lymph nodes received.  TNM: pTis, pNX, MX.  Comments: A quantitative estrogen receptor and progesterone receptor will be performed on the focus of low  grade ductal carcinoma in situ and reported in an addendum. Dr. Luisa Hart has seen the focus of low grade  ductal carcinoma in situ (slide 1D) in consultation with agreement. (RAH:caf 06/23/12)      RADIOGRAPHIC STUDIES:  No results found.  ASSESSMENT:  52 year old female with  #1 diagnosis of left ductal carcinoma in situ diagnosed on a screening mammogram presented with an abnormality in the left breast she underwent a lumpectomy a complex sclerosing lesion with a 0.2 cm focus of ductal carcinoma in situ that was low grade, ER +100% PR +100%. Patient postoperatively is doing well.  2 she was seen by Dr. Lurline Hare who has recommended against radiation therapy.  #3 therefore patient will proceed with antiestrogen therapy with tamoxifen 20 mg daily potential for future breast  cancer this. We discussed the rationale, risks, benefits of therapy. She understands the risks which include but are not limited to accelerated cataracts clots strokes weight gain hot flashes, uterine cancer.   PLAN:   #1 continue tamoxifen 20 mg daily.  #2 patient will be seen back in one year time for followup  All questions were answered. The patient knows to call the clinic with any problems, questions or concerns. We can certainly see the patient much sooner if necessary.  I spent 10 minutes counseling the patient face to face. The total time spent in the appointment was 15 minutes.    Drue Second, MD Medical/Oncology [facility], [place] [phone] (beeper) [phone] (Office)  12/12/2012, 1:15 PM

## 2012-12-12 NOTE — Telephone Encounter (Signed)
appts made and printed...td 

## 2012-12-15 ENCOUNTER — Other Ambulatory Visit: Payer: Self-pay

## 2013-01-25 ENCOUNTER — Encounter (INDEPENDENT_AMBULATORY_CARE_PROVIDER_SITE_OTHER): Payer: Self-pay | Admitting: Surgery

## 2013-06-20 ENCOUNTER — Other Ambulatory Visit: Payer: Self-pay | Admitting: Family Medicine

## 2013-06-20 DIAGNOSIS — Z853 Personal history of malignant neoplasm of breast: Secondary | ICD-10-CM

## 2013-06-23 ENCOUNTER — Ambulatory Visit
Admission: RE | Admit: 2013-06-23 | Discharge: 2013-06-23 | Disposition: A | Discharge status: Home / Self Care | Payer: BC Managed Care – PPO | Source: Ambulatory Visit | Attending: Family Medicine | Admitting: Family Medicine

## 2013-06-23 DIAGNOSIS — Z853 Personal history of malignant neoplasm of breast: Secondary | ICD-10-CM

## 2013-09-18 ENCOUNTER — Ambulatory Visit: Payer: BC Managed Care – PPO | Admitting: Obstetrics and Gynecology

## 2013-10-13 ENCOUNTER — Ambulatory Visit: Payer: BC Managed Care – PPO | Admitting: Obstetrics and Gynecology

## 2013-11-04 ENCOUNTER — Telehealth: Payer: Self-pay | Admitting: Hematology and Oncology

## 2013-11-04 NOTE — Telephone Encounter (Signed)
lmonvm for pt re new appt for lb/vg 11/20 and mailed schedule.

## 2013-12-08 ENCOUNTER — Ambulatory Visit: Payer: BC Managed Care – PPO | Admitting: Obstetrics and Gynecology

## 2013-12-11 ENCOUNTER — Encounter: Payer: Self-pay | Admitting: Oncology

## 2013-12-14 ENCOUNTER — Ambulatory Visit: Payer: BC Managed Care – PPO | Admitting: Oncology

## 2013-12-14 ENCOUNTER — Other Ambulatory Visit: Payer: BC Managed Care – PPO

## 2013-12-15 ENCOUNTER — Ambulatory Visit (INDEPENDENT_AMBULATORY_CARE_PROVIDER_SITE_OTHER): Payer: BC Managed Care – PPO | Admitting: Obstetrics and Gynecology

## 2013-12-15 ENCOUNTER — Encounter: Payer: Self-pay | Admitting: Obstetrics and Gynecology

## 2013-12-15 VITALS — BP 110/80 | HR 70 | Resp 16 | Ht 64.0 in | Wt 193.8 lb

## 2013-12-15 DIAGNOSIS — R319 Hematuria, unspecified: Secondary | ICD-10-CM

## 2013-12-15 DIAGNOSIS — R159 Full incontinence of feces: Secondary | ICD-10-CM

## 2013-12-15 DIAGNOSIS — Z0000 Encounter for general adult medical examination without abnormal findings: Secondary | ICD-10-CM

## 2013-12-15 DIAGNOSIS — Z01419 Encounter for gynecological examination (general) (routine) without abnormal findings: Secondary | ICD-10-CM

## 2013-12-15 LAB — POCT URINALYSIS DIPSTICK
Bilirubin, UA: NEGATIVE
GLUCOSE UA: NEGATIVE
Ketones, UA: NEGATIVE
LEUKOCYTES UA: NEGATIVE
Nitrite, UA: NEGATIVE
PH UA: 5
Protein, UA: NEGATIVE
Urobilinogen, UA: NEGATIVE

## 2013-12-15 NOTE — Progress Notes (Signed)
Patient ID: Tamara Brandt, female   DOB: [birth year], 53 y.o.   MRN: [id number] 53 y.o. G1P1001 MarriedAfrican AmericanF here for annual exam.   Seeing a rheumatologist for joint pain.  Diagnosis is inflammatory arthritis.  Gaining weight and unable to exercise so gaining weight.  Taking methotrexate for joint pain.   Can have some fecal incontinence.  Wears a panty liner.  No urinary leakage.   Stopped Tamoxifen for DCIS due to side effects.  Had lumpectomy without radiation.   PCP: Leighton Ruff, MD  Patient's last menstrual period was 02/09/1989.          Sexually active: No.female  The current method of family planning is status post hysterectomy--still has ovaries.    Exercising: No.  none. Smoker:  no  Health Maintenance: Pap: 2012 wnl  History of abnormal Pap:  Yes, 1991 abnormal pap with colposcopy but no treatment to cervix.  Paps normal since. MMG: 06-2013 density bilateral diag. Mammogram:nl:[facility]:history of DCIS of left breast with lumpectomy 04/2012 Colonoscopy:  05-11-12 normal with Dr. Scarlette Shorts.  Next colonoscopy due 05/2022. BMD:   --- TDaP:  09/2012 Screening Labs: ---, Hgb:  Rheumatologist. Urine today: 1+RBC's - no dysuria.  Some urgency sometimes.  Drinking a lot of water.    reports that she quit smoking about 11 years ago. She has never used smokeless tobacco. She reports that she drinks alcohol. She reports that she does not use illicit drugs.  Past Medical History  Diagnosis Date  . Diverticulosis   . GERD (gastroesophageal reflux disease)   . Psoriasis   . Allergy   . Arthritis   . Seasonal allergies   . Wears glasses   . Heart murmur     dx many yr ago-never had an echo  . Anxiety     new dx  . Breast cancer 04/2012    left breast  . Inflammatory arthritis     Past Surgical History  Procedure Laterality Date  . Cesarean section    . Colonoscopy      2004  . Abdominal hysterectomy  1991    TAH--still has ovaries  . Breast  lumpectomy  2008    excision rt br mass  . Breast lumpectomy  2002    excision lt br mass  . Breast biopsy Left 06/21/2012    Procedure: BREAST BIOPSY WITH NEEDLE LOCALIZATION;  Surgeon: Shann Medal, MD;  Location: [facility];  Service: General;  Laterality: Left;    Current Outpatient Prescriptions  Medication Sig Dispense Refill  . ALOE VERA JUICE PO Take 120 mLs by mouth daily.     Marland Kitchen BIOTIN 5000 PO Take 5,000 tablets by mouth daily.     Marland Kitchen CALCIUM PO Take 600 tablets by mouth daily. With vit d    . cholecalciferol (VITAMIN D) 1000 UNITS tablet Take 1,000 Units by mouth daily.    Marland Kitchen esomeprazole (NEXIUM) 40 MG capsule Take 20 mg by mouth daily before breakfast.     . fexofenadine-pseudoephedrine (ALLEGRA-D) 60-120 MG per tablet Take 1 tablet by mouth as needed.    . Hyaluronic Acid-Vitamin C (HYALURONIC ACID PO) Take 1 tablet by mouth daily.    . hydrALAZINE (APRESOLINE) 25 MG tablet Take 25 mg by mouth as needed (uses for allergies).     . loratadine (CLARITIN) 10 MG tablet Take 10 mg by mouth daily as needed for allergies.    . Methotrexate, PF, 25 MG/0.4ML SOAJ Inject 1 Dose into the skin  once a week. .06mg     . Multiple Vitamin (MULTI-VITAMIN PO) Take 1 tablet by mouth daily.     Marland Kitchen OVER THE COUNTER MEDICATION     . Probiotic Product (PROBIOTIC DAILY PO) Take 1 capsule by mouth daily.     Marland Kitchen triamcinolone (KENALOG) 0.025 % cream Apply 1 application topically as needed.     . vitamin B-12 (CYANOCOBALAMIN) 1000 MCG tablet Take 1,000 mcg by mouth daily.     No current facility-administered medications for this visit.    Family History  Problem Relation Age of Onset  . Diabetes Father   . Hypertension Father   . Heart disease Paternal Aunt   . Heart disease Mother   . Rheum arthritis Mother   . Arthritis Mother   . Hypertension Maternal Grandmother   . Stroke Maternal Grandmother   . Rheum arthritis Sister   . Rheum arthritis Sister     ROS:  Pertinent items  are noted in HPI.  Otherwise, a comprehensive ROS was negative.  Exam:   BP 110/80 mmHg  Pulse 70  Resp 16  Ht 5\' 4"  (1.626 m)  Wt 193 lb 12.8 oz (87.907 kg)  BMI 33.25 kg/m2  LMP 02/09/1989     Height: 5\' 4"  (162.6 cm)  Ht Readings from Last 3 Encounters:  12/15/13 5\' 4"  (1.626 m)  12/12/12 5\' 4"  (1.626 m)  09/16/12 5\' 4"  (1.626 m)    General appearance: alert, cooperative and appears stated age Head: Normocephalic, without obvious abnormality, atraumatic Neck: no adenopathy, supple, symmetrical, trachea midline and thyroid normal to inspection and palpation Lungs: clear to auscultation bilaterally Breasts: normal appearance, no masses or tenderness, Inspection negative, No nipple retraction or dimpling, No nipple discharge or bleeding, No axillary or supraclavicular adenopathy,  Multiple scars bilaterally.  Heart: regular rate and rhythm Abdomen: soft, non-tender; bowel sounds normal; no masses,  no organomegaly Extremities: extremities normal, atraumatic, no cyanosis or edema Skin: Skin color, texture, turgor normal. No rashes or lesions Lymph nodes: Cervical, supraclavicular, and axillary nodes normal. No abnormal inguinal nodes palpated Neurologic: Grossly normal   Pelvic: External genitalia:  no lesions              Urethra:  normal appearing urethra with no masses, tenderness or lesions              Bartholins and Skenes: normal                 Vagina: normal appearing vagina with normal color and discharge, no lesions              Cervix: absent              Pap taken: No. Bimanual Exam:  Uterus:  uterus absent              Adnexa: no mass, fullness, tenderness               Rectovaginal: Confirms               Anus:  normal sphincter tone, no lesions  A:  Well Woman with normal exam Left breast DCIS. Off Tamoxifen. Status post total abdominal hysterectomy. Microscopic hematuria.  Fecal incontinence.   P:   Mammogram yearly.  Discussed 3D mammogram.  pap smear  not indicated.  Urine micro and culture.  Metamucil daily.  Do Kegel exercises.  If fecal incontinence persists, may need to see colon and rectal surgeon for evaluation.  return annually or prn  An After Visit Summary was printed and given to the patient.

## 2013-12-15 NOTE — Patient Instructions (Signed)

## 2013-12-15 NOTE — Addendum Note (Signed)
Addended by: Gerda Diss on: 12/15/2013 10:00 AM   Modules accepted: SmartSet

## 2013-12-16 LAB — URINALYSIS, MICROSCOPIC ONLY
Bacteria, UA: NONE SEEN
Casts: NONE SEEN
Crystals: NONE SEEN

## 2013-12-17 LAB — URINE CULTURE
Colony Count: NO GROWTH
ORGANISM ID, BACTERIA: NO GROWTH

## 2013-12-29 ENCOUNTER — Ambulatory Visit: Payer: BC Managed Care – PPO | Admitting: Hematology and Oncology

## 2013-12-29 ENCOUNTER — Other Ambulatory Visit: Payer: BC Managed Care – PPO

## 2014-01-17 ENCOUNTER — Ambulatory Visit (INDEPENDENT_AMBULATORY_CARE_PROVIDER_SITE_OTHER): Payer: BC Managed Care – PPO

## 2014-01-17 ENCOUNTER — Ambulatory Visit (INDEPENDENT_AMBULATORY_CARE_PROVIDER_SITE_OTHER): Payer: BC Managed Care – PPO | Admitting: Family Medicine

## 2014-01-17 VITALS — BP 145/82 | HR 84 | Temp 98.3°F | Resp 16 | Ht 65.5 in | Wt 192.6 lb

## 2014-01-17 DIAGNOSIS — J40 Bronchitis, not specified as acute or chronic: Secondary | ICD-10-CM

## 2014-01-17 DIAGNOSIS — R059 Cough, unspecified: Secondary | ICD-10-CM

## 2014-01-17 DIAGNOSIS — R0981 Nasal congestion: Secondary | ICD-10-CM

## 2014-01-17 DIAGNOSIS — J309 Allergic rhinitis, unspecified: Secondary | ICD-10-CM

## 2014-01-17 DIAGNOSIS — R062 Wheezing: Secondary | ICD-10-CM

## 2014-01-17 DIAGNOSIS — R0602 Shortness of breath: Secondary | ICD-10-CM

## 2014-01-17 DIAGNOSIS — R05 Cough: Secondary | ICD-10-CM

## 2014-01-17 MED ORDER — AZITHROMYCIN 500 MG PO TABS: 500.0000 mg | ORAL_TABLET | Freq: Every day | ORAL | Status: DC

## 2014-01-17 MED ORDER — FLUTICASONE PROPIONATE 50 MCG/ACT NA SUSP: 2.0000 | Freq: Every day | NASAL | Status: AC

## 2014-01-17 MED ORDER — HYDROCODONE-HOMATROPINE 5-1.5 MG/5ML PO SYRP: 5.0000 mL | ORAL_SOLUTION | Freq: Three times a day (TID) | ORAL | Status: DC | PRN

## 2014-01-17 MED ORDER — ALBUTEROL SULFATE HFA 108 (90 BASE) MCG/ACT IN AERS
2.0000 | INHALATION_SPRAY | RESPIRATORY_TRACT | Status: DC | PRN
Start: 2014-01-17 — End: 2016-08-06

## 2014-01-17 MED ORDER — GUAIFENESIN ER 1200 MG PO TB12: 1.0000 | ORAL_TABLET | Freq: Two times a day (BID) | ORAL | Status: DC | PRN

## 2014-01-17 MED ORDER — ALBUTEROL SULFATE (2.5 MG/3ML) 0.083% IN NEBU
2.5000 mg | INHALATION_SOLUTION | Freq: Once | RESPIRATORY_TRACT | Status: AC
  Administered 2014-01-17: 2.5 mg via RESPIRATORY_TRACT

## 2014-01-17 MED ORDER — IPRATROPIUM BROMIDE 0.03 % NA SOLN: 2.0000 | Freq: Two times a day (BID) | NASAL | Status: DC

## 2014-01-17 MED ORDER — IPRATROPIUM BROMIDE 0.02 % IN SOLN
0.5000 mg | Freq: Once | RESPIRATORY_TRACT | Status: AC
  Administered 2014-01-17: 0.5 mg via RESPIRATORY_TRACT

## 2014-01-17 MED ORDER — CETIRIZINE HCL 10 MG PO TABS: 10.0000 mg | ORAL_TABLET | Freq: Every day | ORAL | Status: AC

## 2014-01-17 NOTE — Progress Notes (Signed)
MRN: [id number] DOB: 03/29/1960  Subjective:   Tamara Brandt is a 53 y.o. female presenting for 4 week history of productive cough with green sputum and ongoing sinus congestion. In the past week, patient has had intermittent shob worse with coughing, chest congestion and tinge of blood x 3 in her sputum. Also feels raw throat from so much coughing. Denies sinus pain, ear pain, fevers, chest pain, chest tightness, n/v, abdominal pain. Reports history of severe allergies, using netty pot, Allegra or Claritin PRN. Denies history of asthma. Reports history of GERD, well controlled, avoids trigger foods, takes Nexium. Patient does have a history of Rheumatoid Arthritis, started taking Methotrexate and Humira in the last year but was advised by her Rheumatologist to stop this medication 3 weeks ago d/t her symptoms. Patient was seen by nurse practitioner at her work clinic, prescribed Amoxicillin for 10 days, finished course 6 days ago. States that this made no impact on her cough. Patient is not currently smoking, quit > 10 years ago, ~10 ppd smoking history. Rare alcoholic drink. Denies any other aggravating or relieving factors, no other questions or concerns.   Tamara Brandt has a current medication list which includes the following prescription(s): adalimumab, aloe vera, biotin, calcium, cholecalciferol, esomeprazole, fexofenadine-pseudoephedrine, hyaluronic acid-vitamin c, hydralazine, loratadine, methotrexate (pf), multiple vitamin, OVER THE COUNTER MEDICATION, probiotic product, triamcinolone, and vitamin b-12.  Tamara Brandt is allergic to hydrocodone.   Tamara Brandt  has a past medical history of Diverticulosis; GERD (gastroesophageal reflux disease); Psoriasis; Allergy; Arthritis; Seasonal allergies; Wears glasses; Heart murmur; Anxiety; Breast cancer (04/2012); and Inflammatory arthritis. Also  has past surgical history that includes Cesarean section; Colonoscopy; Abdominal hysterectomy  (1991); Breast lumpectomy (2008); Breast lumpectomy (2002); and Breast biopsy (Left, 06/21/2012).  ROS As in subjective.  Objective:   Vitals: BP 145/82 mmHg  Pulse 84  Temp(Src) 98.3 F (36.8 C) (Oral)  Resp 16  Ht 5' 5.5" (1.664 m)  Wt 192 lb 9.6 oz (87.363 kg)  BMI 31.55 kg/m2  SpO2 95%  LMP 02/09/1989  Physical Exam  Constitutional: She is oriented to person, place, and time and well-developed, well-nourished, and in no distress.  HENT:  Right Ear: Tympanic membrane, external ear and ear canal normal. No drainage.  Left Ear: Tympanic membrane, external ear and ear canal normal. No drainage.  Nose: Mucosal edema and rhinorrhea present. No sinus tenderness.  Mouth/Throat: No oropharyngeal exudate, posterior oropharyngeal edema, posterior oropharyngeal erythema or tonsillar abscesses.  Eyes: Conjunctivae and EOM are normal. Pupils are equal, round, and reactive to light. Right eye exhibits no discharge. Left eye exhibits no discharge. No scleral icterus.  Neck: Normal range of motion.  Cardiovascular: Normal rate, regular rhythm and intact distal pulses.  Exam reveals no gallop and no friction rub.   Murmur (Grade II/VI systolic ejection murmur, not a new finding) heard. Pulmonary/Chest: Effort normal. No stridor. No respiratory distress. She has wheezes (expiratory wheezing on forced expiration). She has no rales. She exhibits no tenderness.  Abdominal: Soft. Bowel sounds are normal. She exhibits no mass. There is no tenderness.  Musculoskeletal: She exhibits no edema or tenderness.  Lymphadenopathy:    She has no cervical adenopathy.  Neurological: She is alert and oriented to person, place, and time.  Skin: Skin is warm and dry. She is not diaphoretic.  Psychiatric: Mood and affect normal.   [facility] reading (PRIMARY) by  Dr. Marin Comment and PA-Khayden Herzberg. CXR: Possible left perihilar infiltrates, comparison to CXR 2008, no other acute changes, densities noted.  Patient does have a history of  2 lumpectomies left breast (2002, 2014) and 1 on the right (2008).   Dg Chest 2 View  01/17/2014   CLINICAL DATA:  Chronic cough.  EXAM: CHEST  2 VIEW  COMPARISON:  06/14/2006.  FINDINGS: There is stable mild cardiac enlargement. The mediastinal contours are stable. The lungs demonstrate central airway thickening with mild atelectasis in the lingula, partly obscuring the left heart border. There is no confluent airspace opacity or pleural effusion. No osseous abnormalities are apparent.  IMPRESSION: Lingular atelectasis and central airway thickening. No radiographic evidence of bronchiectasis or definite acute process. CT may be helpful for further evaluation if the patient has persistent unexplained symptoms.   Electronically Signed   By: Camie Patience M.D.   On: 01/17/2014 18:26   Assessment and Plan :   1. Bronchitis - azithromycin (ZITHROMAX) 500 MG tablet; Take 1 tablet (500 mg total) by mouth daily.  Dispense: 3 tablet; Refill: 0 - advised patient to return to clinic if symptoms worsen, fail to resolve or as needed  2. Cough - likely d/t lingering bronchitis, postnasal drip, mild atelectasis of lingula - DG Chest 2 View; Future - HYDROcodone-homatropine (HYCODAN) 5-1.5 MG/5ML syrup; Take 5 mLs by mouth every 8 (eight) hours as needed for cough.  Dispense: 120 mL; Refill: 0 - advised to stop Hycodan if patient experiences adverse reaction although unlikely given that she used hycodan 2013 with significant relief  3. Expiratory wheezing - patient slightly better s/p nebulizer treatment - albuterol (PROVENTIL) (2.5 MG/3ML) 0.083% nebulizer solution 2.5 mg; Take 3 mLs (2.5 mg total) by nebulization once. - ipratropium (ATROVENT) nebulizer solution 0.5 mg; Take 2.5 mLs (0.5 mg total) by nebulization once. - albuterol (PROVENTIL HFA;VENTOLIN HFA) 108 (90 BASE) MCG/ACT inhaler; Inhale 2 puffs into the lungs every 4 (four) hours as needed for wheezing or shortness of breath (cough, shortness of  breath or wheezing.).  Dispense: 1 Inhaler; Refill: 1  4. Shortness of breath - multifactorial including bronchitis, uncontrolled allergies, does not appear to be cardiac source - albuterol (PROVENTIL) (2.5 MG/3ML) 0.083% nebulizer solution 2.5 mg; Take 3 mLs (2.5 mg total) by nebulization once. - ipratropium (ATROVENT) nebulizer solution 0.5 mg; Take 2.5 mLs (0.5 mg total) by nebulization once. - albuterol (PROVENTIL HFA;VENTOLIN HFA) 108 (90 BASE) MCG/ACT inhaler; Inhale 2 puffs into the lungs every 4 (four) hours as needed for wheezing or shortness of breath (cough, shortness of breath or wheezing.).  Dispense: 1 Inhaler; Refill: 1  5. Allergic rhinitis, unspecified allergic rhinitis type 6. Nasal congestion - will attempt to control allergies to help resolve cough and congestion - ipratropium (ATROVENT) 0.03 % nasal spray; Place 2 sprays into both nostrils 2 (two) times daily.  Dispense: 30 mL; Refill: 0 - fluticasone (FLONASE) 50 MCG/ACT nasal spray; Place 2 sprays into both nostrils daily.  Dispense: 16 g; Refill: 12 - cetirizine (ZYRTEC) 10 MG tablet; Take 1 tablet (10 mg total) by mouth daily.  Dispense: 30 tablet; Refill: 11 - Guaifenesin (MUCINEX MAXIMUM STRENGTH) 1200 MG TB12; Take 1 tablet (1,200 mg total) by mouth every 12 (twelve) hours as needed.  Dispense: 30 tablet; Refill: [place], PA-C [facility] [phone] 01/17/2014 5:36 PM

## 2014-01-17 NOTE — Patient Instructions (Signed)
- Once your symptoms are under control, please stop the Atrovent nasal spray (you may resume it as needed), but continue the Flonase nasal spray (at least for the duration of your allergy season).  - For appropriate administration of nasal spray, clear the nose, use opposite hand for opposite nare, sniff gently, exhale through your mouth and remember to take these two sprays at least 30 minutes apart. - Continue Zyrtec each day, as needed. - Drink at least 64 ounces of water each day. - Remove as many irritants/allergies as you are able to.    Acute Bronchitis Bronchitis is inflammation of the airways that extend from the windpipe into the lungs (bronchi). The inflammation often causes mucus to develop. This leads to a cough, which is the most common symptom of bronchitis.  In acute bronchitis, the condition usually develops suddenly and goes away over time, usually in a couple weeks. Smoking, allergies, and asthma can make bronchitis worse. Repeated episodes of bronchitis may cause further lung problems.  CAUSES Acute bronchitis is most often caused by the same virus that causes a cold. The virus can spread from person to person (contagious) through coughing, sneezing, and touching contaminated objects. SIGNS AND SYMPTOMS   Cough.   Fever.   Coughing up mucus.   Body aches.   Chest congestion.   Chills.   Shortness of breath.   Sore throat.  DIAGNOSIS  Acute bronchitis is usually diagnosed through a physical exam. Your health care provider will also ask you questions about your medical history. Tests, such as chest X-rays, are sometimes done to rule out other conditions.  TREATMENT  Acute bronchitis usually goes away in a couple weeks. Oftentimes, no medical treatment is necessary. Medicines are sometimes given for relief of fever or cough. Antibiotic medicines are usually not needed but may be prescribed in certain situations. In some cases, an inhaler may be recommended to  help reduce shortness of breath and control the cough. A cool mist vaporizer may also be used to help thin bronchial secretions and make it easier to clear the chest.  HOME CARE INSTRUCTIONS  Get plenty of rest.   Drink enough fluids to keep your urine clear or pale yellow (unless you have a medical condition that requires fluid restriction). Increasing fluids may help thin your respiratory secretions (sputum) and reduce chest congestion, and it will prevent dehydration.   Take medicines only as directed by your health care provider.  If you were prescribed an antibiotic medicine, finish it all even if you start to feel better.  Avoid smoking and secondhand smoke. Exposure to cigarette smoke or irritating chemicals will make bronchitis worse. If you are a smoker, consider using nicotine gum or skin patches to help control withdrawal symptoms. Quitting smoking will help your lungs heal faster.   Reduce the chances of another bout of acute bronchitis by washing your hands frequently, avoiding people with cold symptoms, and trying not to touch your hands to your mouth, nose, or eyes.   Keep all follow-up visits as directed by your health care provider.  SEEK MEDICAL CARE IF: Your symptoms do not improve after 1 week of treatment.  SEEK IMMEDIATE MEDICAL CARE IF:  You develop an increased fever or chills.   You have chest pain.   You have severe shortness of breath.  You have bloody sputum.   You develop dehydration.  You faint or repeatedly feel like you are going to pass out.  You develop repeated vomiting.  You develop  a severe headache. MAKE SURE YOU:   Understand these instructions.  Will watch your condition.  Will get help right away if you are not doing well or get worse. Document Released: 03/05/2004 Document Revised: 06/12/2013 Document Reviewed: 07/19/2012 [facility] Patient Information 2015 [place], [place]. This information is not intended to replace advice  given to you by your health care provider. Make sure you discuss any questions you have with your health care provider.

## 2014-01-19 NOTE — Progress Notes (Signed)
I have seen and evaluated pt, Agree with A/P. Dr Marin Comment

## 2014-08-08 ENCOUNTER — Other Ambulatory Visit: Payer: Self-pay | Admitting: Family Medicine

## 2014-08-08 DIAGNOSIS — Z1231 Encounter for screening mammogram for malignant neoplasm of breast: Secondary | ICD-10-CM

## 2014-08-08 DIAGNOSIS — Z853 Personal history of malignant neoplasm of breast: Secondary | ICD-10-CM

## 2014-08-22 ENCOUNTER — Ambulatory Visit
Admission: RE | Admit: 2014-08-22 | Discharge: 2014-08-22 | Disposition: A | Discharge status: Home / Self Care | Payer: BLUE CROSS/BLUE SHIELD | Source: Ambulatory Visit | Attending: Family Medicine | Admitting: Family Medicine

## 2014-08-22 DIAGNOSIS — Z853 Personal history of malignant neoplasm of breast: Secondary | ICD-10-CM

## 2014-12-24 ENCOUNTER — Telehealth: Payer: Self-pay | Admitting: Obstetrics and Gynecology

## 2014-12-24 NOTE — Telephone Encounter (Signed)
Thank you for the update.  [place]

## 2014-12-24 NOTE — Telephone Encounter (Signed)
Patient called to canceled her annual appointment for 12/28/14. She states she is having her pap done at the on site clinic at her job. She also request no more emails, texts and reminder call for appointments. She is not in recall.

## 2014-12-28 ENCOUNTER — Ambulatory Visit: Payer: Self-pay | Admitting: Obstetrics and Gynecology

## 2015-08-26 ENCOUNTER — Other Ambulatory Visit: Payer: Self-pay | Admitting: Family Medicine

## 2015-08-26 DIAGNOSIS — Z1231 Encounter for screening mammogram for malignant neoplasm of breast: Secondary | ICD-10-CM

## 2015-09-03 ENCOUNTER — Other Ambulatory Visit: Payer: Self-pay | Admitting: Family Medicine

## 2015-09-03 ENCOUNTER — Ambulatory Visit
Admission: RE | Admit: 2015-09-03 | Discharge: 2015-09-03 | Disposition: A | Discharge status: Home / Self Care | Payer: BLUE CROSS/BLUE SHIELD | Source: Ambulatory Visit | Attending: Family Medicine | Admitting: Family Medicine

## 2015-09-03 DIAGNOSIS — Z1231 Encounter for screening mammogram for malignant neoplasm of breast: Secondary | ICD-10-CM

## 2015-09-03 DIAGNOSIS — Z853 Personal history of malignant neoplasm of breast: Secondary | ICD-10-CM

## 2016-08-06 ENCOUNTER — Emergency Department (HOSPITAL_COMMUNITY): Payer: BLUE CROSS/BLUE SHIELD

## 2016-08-06 ENCOUNTER — Emergency Department (HOSPITAL_COMMUNITY)
Admission: EM | Admit: 2016-08-06 | Discharge: 2016-08-06 | Disposition: A | Discharge status: Home / Self Care | Payer: BLUE CROSS/BLUE SHIELD | Attending: Emergency Medicine | Admitting: Emergency Medicine

## 2016-08-06 DIAGNOSIS — R51 Headache: Secondary | ICD-10-CM | POA: Diagnosis present

## 2016-08-06 DIAGNOSIS — Z853 Personal history of malignant neoplasm of breast: Secondary | ICD-10-CM | POA: Insufficient documentation

## 2016-08-06 DIAGNOSIS — G43009 Migraine without aura, not intractable, without status migrainosus: Secondary | ICD-10-CM | POA: Insufficient documentation

## 2016-08-06 DIAGNOSIS — G43909 Migraine, unspecified, not intractable, without status migrainosus: Secondary | ICD-10-CM

## 2016-08-06 DIAGNOSIS — Z87891 Personal history of nicotine dependence: Secondary | ICD-10-CM | POA: Insufficient documentation

## 2016-08-06 DIAGNOSIS — Z79899 Other long term (current) drug therapy: Secondary | ICD-10-CM | POA: Diagnosis not present

## 2016-08-06 DIAGNOSIS — R0789 Other chest pain: Secondary | ICD-10-CM | POA: Diagnosis not present

## 2016-08-06 LAB — URINALYSIS, ROUTINE W REFLEX MICROSCOPIC
Bilirubin Urine: NEGATIVE
Glucose, UA: NEGATIVE mg/dL
Ketones, ur: NEGATIVE mg/dL
Leukocytes, UA: NEGATIVE
Nitrite: NEGATIVE
PH: 6 (ref 5.0–8.0)
Protein, ur: NEGATIVE mg/dL
Specific Gravity, Urine: 1.016 (ref 1.005–1.030)

## 2016-08-06 LAB — CBC
HCT: 40.9 % (ref 36.0–46.0)
Hemoglobin: 13.6 g/dL (ref 12.0–15.0)
MCH: 26.5 pg (ref 26.0–34.0)
MCHC: 33.3 g/dL (ref 30.0–36.0)
MCV: 79.7 fL (ref 78.0–100.0)
PLATELETS: 228 10*3/uL (ref 150–400)
RBC: 5.13 MIL/uL — AB (ref 3.87–5.11)
RDW: 13.1 % (ref 11.5–15.5)
WBC: 8.8 10*3/uL (ref 4.0–10.5)

## 2016-08-06 LAB — BASIC METABOLIC PANEL
Anion gap: 11 (ref 5–15)
BUN: 13 mg/dL (ref 6–20)
CALCIUM: 9.3 mg/dL (ref 8.9–10.3)
CO2: 27 mmol/L (ref 22–32)
Chloride: 102 mmol/L (ref 101–111)
Creatinine, Ser: 0.68 mg/dL (ref 0.44–1.00)
GFR calc Af Amer: >60 mL/min (ref >60)
GFR calc non Af Amer: >60 mL/min (ref >60)
GLUCOSE: 96 mg/dL (ref 65–99)
Potassium: 3.7 mmol/L (ref 3.5–5.1)
Sodium: 140 mmol/L (ref 135–145)

## 2016-08-06 LAB — CBG MONITORING, ED: GLUCOSE-CAPILLARY: 100 mg/dL — AB (ref 65–99)

## 2016-08-06 LAB — I-STAT TROPONIN, ED: Troponin i, poc: 0 ng/mL (ref 0.00–0.08)

## 2016-08-06 LAB — D-DIMER, QUANTITATIVE (NOT AT ARMC): D-Dimer, Quant: 0.31 ug/mL-FEU (ref 0.00–0.50)

## 2016-08-06 MED ORDER — DIPHENHYDRAMINE HCL 50 MG/ML IJ SOLN
12.5000 mg | Freq: Once | INTRAMUSCULAR | Status: AC
  Administered 2016-08-06: 12.5 mg via INTRAVENOUS
  Filled 2016-08-06: qty 1

## 2016-08-06 MED ORDER — ASPIRIN 81 MG PO CHEW
324.0000 mg | CHEWABLE_TABLET | Freq: Once | ORAL | Status: AC
  Administered 2016-08-06: 324 mg via ORAL
  Filled 2016-08-06: qty 4

## 2016-08-06 MED ORDER — PROCHLORPERAZINE EDISYLATE 5 MG/ML IJ SOLN
10.0000 mg | Freq: Once | INTRAMUSCULAR | Status: AC
  Administered 2016-08-06: 10 mg via INTRAVENOUS
  Filled 2016-08-06: qty 2

## 2016-08-06 MED ORDER — GI COCKTAIL ~~LOC~~
30.0000 mL | Freq: Once | ORAL | Status: AC
  Administered 2016-08-06: 30 mL via ORAL
  Filled 2016-08-06: qty 30

## 2016-08-06 MED ORDER — KETOROLAC TROMETHAMINE 30 MG/ML IJ SOLN
15.0000 mg | Freq: Once | INTRAMUSCULAR | Status: AC
  Administered 2016-08-06: 15 mg via INTRAVENOUS
  Filled 2016-08-06: qty 1

## 2016-08-06 MED ORDER — SODIUM CHLORIDE 0.9 % IV BOLUS (SEPSIS)
1000.0000 mL | Freq: Once | INTRAVENOUS | Status: AC
  Administered 2016-08-06: 1000 mL via INTRAVENOUS

## 2016-08-06 NOTE — ED Notes (Signed)
Patient transported to CT 

## 2016-08-06 NOTE — ED Triage Notes (Signed)
Pt c/o mild chest pain that radiates to left shoulder, also c/o neck and upper back pain

## 2016-08-06 NOTE — ED Provider Notes (Signed)
[facility] DEPT Provider Note   CSN: [id number] Arrival date & time: 08/06/16  1660     History   Chief Complaint Chief Complaint  Patient presents with  . Chest Pain    mid chest to left arm and shoulder  . Headache    1 WEEK    HPI Tamara Brandt is a 56 y.o. female.  HPI   56 year old African-American female past medical history significant for breast cancer currently in remission, GERD, anxiety and that presents to the emergency Department today with complaints of headache, chest pain, neck pain. Patient states that last week she developed left-sided chest pain and neck pain that was causing a mild headache. She was seen by a doctor at her work who prescribed her prednisone, tramadol, nausea medicine. States that her chest pain and neck pain improved. However this did not help her headache. She states that her headache has gradually worsened. Describes as left-sided and throbbing. Associated with nausea, photophobia, phonophobia. Patient does states she has a history of migraine and that this initially felt like a migraine but has not improved with her home medications. Patient also complains of substernal chest pain that has been intermittent for the past week. Last pain was one day ago. States is substernal. It radiates to her left arm and back. Nothing makes better or worse. She has not tried nothing for her symptoms. States that the prednisone to help once occurred last week.  Associated with mild shortness of breaththat seems pleuritic in nature. She denies any history of DVT/PE, prolonged immobilization, recent hospitalizations or surgeries, lower extremity edema or calf tenderness. Patient has a cardiac history. She does have a history of breast cancer which she received chemotherapy radiation but this is been 4 years ago.   Pt denies any fever, chill, vision changes, lightheadedness, dizziness, congestion, cough, abd pain, n/v/d, urinary symptoms, change in bowel habits,  melena, hematochezia, lower extremity paresthesias.  Past Medical History:  Diagnosis Date  . Allergy   . Anxiety    new dx  . Arthritis   . Breast cancer 04/2012   left breast  . Diverticulosis   . GERD (gastroesophageal reflux disease)   . Heart murmur    dx many yr ago-never had an echo  . Inflammatory arthritis   . Psoriasis   . Seasonal allergies   . Wears glasses     Patient Active Problem List   Diagnosis Date Noted  . 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 ABDOMINAL HYSTERECTOMY  1991   TAH--still has ovaries  . BREAST BIOPSY Left 06/21/2012   Procedure: BREAST BIOPSY WITH NEEDLE LOCALIZATION;  Surgeon: Shann Medal, MD;  Location: [facility];  Service: General;  Laterality: Left;  . BREAST LUMPECTOMY  2008   excision rt br mass  . BREAST LUMPECTOMY  2002   excision lt br mass  . CESAREAN SECTION    . COLONOSCOPY     2004    OB History    Gravida Para Term Preterm AB Living   1 1 1     1    SAB TAB Ectopic Multiple Live Births                   Home Medications    Prior to Admission medications   Medication Sig Start Date End Date Taking? Authorizing Provider  acidophilus (RISAQUAD) CAPS capsule Take 1 capsule by mouth daily.  Yes [provider]  Calcium Carbonate-Vitamin D (CALCIUM 600+D) 600-400 MG-UNIT tablet Take 1 tablet by mouth daily.   Yes [provider]  cetirizine (ZYRTEC) 10 MG tablet Take 1 tablet (10 mg total) by mouth daily. Patient taking differently: Take 10 mg by mouth daily as needed for allergies.  01/17/14  Yes Jaynee Eagles, PA-C  cholecalciferol (VITAMIN D) 1000 UNITS tablet Take 1,000 Units by mouth daily.   Yes [provider]  esomeprazole (NEXIUM) 20 MG capsule Take 20 mg by mouth daily at 12 noon.   Yes [provider]  fluticasone (FLONASE) 50 MCG/ACT nasal spray Place 2 sprays into both nostrils  daily. Patient taking differently: Place 2 sprays into both nostrils daily as needed for rhinitis.  01/17/14  Yes Jaynee Eagles, PA-C  hydrALAZINE (APRESOLINE) 25 MG tablet Take 25 mg by mouth 3 (three) times daily as needed (for itching).    Yes [provider]  Multiple Vitamin (MULTIVITAMIN WITH MINERALS) TABS tablet Take 1 tablet by mouth daily.   Yes [provider]  Multiple Vitamins-Minerals (HAIR/SKIN/NAILS) CAPS Take 1 capsule by mouth daily.   Yes [provider]  triamcinolone (KENALOG) 0.025 % cream Apply 1 application topically 2 (two) times daily as needed (for itching).    Yes [provider]    Family History Family History  Problem Relation Age of Onset  . Diabetes Father   . Hypertension Father   . Heart disease Paternal Aunt   . Heart disease Mother   . Rheum arthritis Mother   . Arthritis Mother   . Hypertension Maternal Grandmother   . Stroke Maternal Grandmother   . Rheum arthritis Sister   . Rheum arthritis Sister     Social History Social History  Substance Use Topics  . Smoking status: Former Smoker    Quit date: 06/15/2002  . Smokeless tobacco: Never Used  . Alcohol use 0.0 oz/week     Comment: rare     Allergies   Hydrocodone   Review of Systems Review of Systems  Constitutional: Negative for chills, diaphoresis and fever.  HENT: Negative for congestion.   Eyes: Positive for photophobia. Negative for visual disturbance.  Respiratory: Positive for shortness of breath. Negative for cough.   Cardiovascular: Positive for chest pain. Negative for palpitations and leg swelling.  Gastrointestinal: Positive for nausea. Negative for abdominal pain, diarrhea and vomiting.  Genitourinary: Negative for dysuria, flank pain, frequency, hematuria, urgency, vaginal bleeding and vaginal discharge.  Musculoskeletal: Negative for arthralgias, myalgias, neck pain and neck stiffness.  Skin: Negative for rash.  Neurological:  Positive for headaches. Negative for dizziness, syncope, weakness, light-headedness and numbness.  Psychiatric/Behavioral: Negative for sleep disturbance. The patient is not nervous/anxious.      Physical Exam Updated Vital Signs BP 121/60 (BP Location: Left Arm)   Pulse 64   Temp 98 F (36.7 C) (Oral)   Resp 18   Ht 5\' 3"  (1.6 m)   Wt 86.6 kg (191 lb)   LMP 02/09/1989   SpO2 100%   BMI 33.83 kg/m   Physical Exam  Constitutional: She is oriented to person, place, and time. She appears well-developed and well-nourished.  Non-toxic appearance. No distress.  HENT:  Head: Normocephalic and atraumatic.  Nose: Nose normal.  Mouth/Throat: Oropharynx is clear and moist.  Eyes: Conjunctivae and EOM are normal. Pupils are equal, round, and reactive to light. Right eye exhibits no discharge. Left eye exhibits no discharge.  Neck: Normal range of motion. Neck supple.  No JVD present. No tracheal deviation present.  No c spine midline tenderness. No paraspinal tenderness. No deformities or step offs noted. Full ROM. Supple. No nuchal rigidity.    Cardiovascular: Normal rate, regular rhythm, normal heart sounds and intact distal pulses.  Exam reveals no gallop and no friction rub.   No murmur heard. Pulmonary/Chest: Effort normal and breath sounds normal. No respiratory distress. She exhibits no tenderness.  No hypoxia or tachypnea.  Abdominal: Soft. Bowel sounds are normal. She exhibits no distension. There is no tenderness. There is no rebound and no guarding.  Musculoskeletal: Normal range of motion.  No lower extremity edema or calf tenderness.  Lymphadenopathy:    She has no cervical adenopathy.  Neurological: She is alert and oriented to person, place, and time.  The patient is alert, attentive, and oriented x 3. Speech is clear. Cranial nerve II-VII grossly intact. Negative pronator drift. Sensation intact. Strength 5/5 in all extremities. Reflexes 2+ and symmetric at biceps, triceps,  knees, and ankles. Rapid alternating movement and fine finger movements intact. Romberg is absent. Posture and gait normal.   Skin: Skin is warm and dry. Capillary refill takes less than 2 seconds. She is not diaphoretic.  Psychiatric: Her behavior is normal. Judgment and thought content normal.  Nursing note and vitals reviewed.    ED Treatments / Results  Labs (all labs ordered are listed, but only abnormal results are displayed) Labs Reviewed  CBC - Abnormal; Notable for the following:       Result Value   RBC 5.13 (*)    All other components within normal limits  URINALYSIS, ROUTINE W REFLEX MICROSCOPIC - Abnormal; Notable for the following:    APPearance HAZY (*)    Hgb urine dipstick SMALL (*)    Bacteria, UA RARE (*)    Squamous Epithelial / LPF 6-30 (*)    All other components within normal limits  CBG MONITORING, ED - Abnormal; Notable for the following:    Glucose-Capillary 100 (*)    All other components within normal limits  BASIC METABOLIC PANEL  D-DIMER, QUANTITATIVE (NOT AT [facility])  I-STAT TROPOININ, ED    EKG  EKG Interpretation  Date/Time:  Thursday August 06 2016 07:06:00 EDT Ventricular Rate:  71 PR Interval:    QRS Duration: 88 QT Interval:  393 QTC Calculation: 428 R Axis:   9 Text Interpretation:  Sinus rhythm Abnormal R-wave progression, early transition No significant change since last tracing Confirmed by Theotis Burrow [phone]) on 08/06/2016 11:27:01 AM       Radiology Dg Chest 2 View  Result Date: 08/06/2016 CLINICAL DATA:  Chest pain. EXAM: CHEST  2 VIEW COMPARISON:  01/17/2014 . FINDINGS: Mediastinum and hilar structures normal. Heart size stable. Mild left mid lung field subsegmental atelectasis and/or scarring. Degenerative changes and scoliosis thoracic spine . IMPRESSION: Mild left mid lung field subsegmental atelectasis and/or scarring. No acute abnormality identified. Electronically Signed   By: Marcello Moores  Register   On: 08/06/2016 07:43    Ct Head Wo Contrast  Result Date: 08/06/2016 CLINICAL DATA:  Headache.  Breast cancer EXAM: CT HEAD WITHOUT CONTRAST CT CERVICAL SPINE WITHOUT CONTRAST TECHNIQUE: Multidetector CT imaging of the head and cervical spine was performed following the standard protocol without intravenous contrast. Multiplanar CT image reconstructions of the cervical spine were also generated. COMPARISON:  None. FINDINGS: CT HEAD FINDINGS Brain: No evidence of acute infarction, hemorrhage, hydrocephalus, extra-axial collection or mass lesion/mass effect. Vascular: No hyperdense vessel or unexpected calcification. Skull: Negative  Sinuses/Orbits: Negative Other: None CT CERVICAL SPINE FINDINGS Alignment: Normal Skull base and vertebrae: Negative for fracture or mass. Negative for metastatic disease. Soft tissues and spinal canal: Negative Disc levels: Mild disc degeneration and mild spurring at C3-4, C4-5. No significant spinal stenosis. Upper chest: Negative Other: None IMPRESSION: Negative CT head Mild cervical spine degenerative change.  No acute abnormality. Electronically Signed   By: Franchot Gallo M.D.   On: 08/06/2016 10:15   Ct Cervical Spine Wo Contrast  Result Date: 08/06/2016 CLINICAL DATA:  Headache.  Breast cancer EXAM: CT HEAD WITHOUT CONTRAST CT CERVICAL SPINE WITHOUT CONTRAST TECHNIQUE: Multidetector CT imaging of the head and cervical spine was performed following the standard protocol without intravenous contrast. Multiplanar CT image reconstructions of the cervical spine were also generated. COMPARISON:  None. FINDINGS: CT HEAD FINDINGS Brain: No evidence of acute infarction, hemorrhage, hydrocephalus, extra-axial collection or mass lesion/mass effect. Vascular: No hyperdense vessel or unexpected calcification. Skull: Negative Sinuses/Orbits: Negative Other: None CT CERVICAL SPINE FINDINGS Alignment: Normal Skull base and vertebrae: Negative for fracture or mass. Negative for metastatic disease. Soft tissues  and spinal canal: Negative Disc levels: Mild disc degeneration and mild spurring at C3-4, C4-5. No significant spinal stenosis. Upper chest: Negative Other: None IMPRESSION: Negative CT head Mild cervical spine degenerative change.  No acute abnormality. Electronically Signed   By: Franchot Gallo M.D.   On: 08/06/2016 10:15    Procedures Procedures (including critical care time)  Medications Ordered in ED Medications  aspirin chewable tablet 324 mg (324 mg Oral Given 08/06/16 0747)  gi cocktail (Maalox,Lidocaine,Donnatal) (30 mLs Oral Given 08/06/16 0747)  prochlorperazine (COMPAZINE) injection 10 mg (10 mg Intravenous Given 08/06/16 0851)  diphenhydrAMINE (BENADRYL) injection 12.5 mg (12.5 mg Intravenous Given 08/06/16 0851)  sodium chloride 0.9 % bolus 1,000 mL (0 mLs Intravenous Stopped 08/06/16 1153)  ketorolac (TORADOL) 30 MG/ML injection 15 mg (15 mg Intravenous Given 08/06/16 1103)     Initial Impression / Assessment and Plan / ED Course  I have reviewed the triage vital signs and the nursing notes.  Pertinent labs & imaging results that were available during my care of the patient were reviewed by me and considered in my medical decision making (see chart for details).      Patient presents to the emergency department today with multiple complaints including headache, chest pain, shortness of breath. Patient with history of breast cancer has been in remission for the past 4 years. States that she developed headache, neck pain, chest pain 1 week ago. Treated by her PCP with prednisone which improved her chest and neck pain however her headache has continued. Does have history of migraines.  Patient has no focal neuro deficits.Pt presentation seems consistent with a complex migraine. He was given a migraine cocktail in the ED With significant relief in her headache.. Given history of cancer. CT scan of the brain was indicated to rule out any metastasis or masses. CT scan shows no acute  abnormalities including mets. Patient has no nuchal rigidity concerning for meningitis.  Chest pain is not likely of cardiac or pulmonary etiology d/t presentation, d-dimer negative, VSS, no tracheal deviation, no JVD or new murmur, RRR, breath sounds equal bilaterally, EKG without any change from prior tracing and shows no signs of ischemia, negative troponin, and negative CXR. Low suspicion for acs and pe. Pt pain seems more consistent with msk pain. Pt is pain free on d/c. Pt has been advised to return to the ED is CP becomes  exertional, associated with diaphoresis or nausea, radiates to left jaw/arm, worsens or becomes concerning in any way. Pt appears reliable for follow up and is agreeable to discharge.   Case has been discussed with  Dr. Rex Kras who agrees with the above plan to discharge.     Final Clinical Impressions(s) / ED Diagnoses   Final diagnoses:  Migraine without status migrainosus, not intractable, unspecified migraine type  Atypical chest pain    New Prescriptions Discharge Medication List as of 08/06/2016 11:47 AM       Doristine Devoid, PA-C 08/06/16 1546    Little, Wenda Overland, MD 08/07/16 [phone]

## 2016-08-06 NOTE — ED Notes (Signed)
EDPA Provider at bedside. 

## 2016-08-06 NOTE — Discharge Instructions (Signed)
Workup and imaging has been normal. Headache was likely a complicated migraine. Her workup has been reassuring. Unsure of etiology of chest pain. No signs of heart attack or blood clot. Need close follow-up with her primary care doctor. I have given you a referral to neurology. Continue your pain medicine at home as needed. Return if worsening symptoms.

## 2016-09-09 ENCOUNTER — Other Ambulatory Visit: Payer: Self-pay | Admitting: Family Medicine

## 2016-09-09 DIAGNOSIS — Z853 Personal history of malignant neoplasm of breast: Secondary | ICD-10-CM

## 2016-09-18 ENCOUNTER — Other Ambulatory Visit: Payer: Self-pay | Admitting: Family Medicine

## 2016-09-18 ENCOUNTER — Ambulatory Visit
Admission: RE | Admit: 2016-09-18 | Discharge: 2016-09-18 | Disposition: A | Discharge status: Home / Self Care | Payer: BLUE CROSS/BLUE SHIELD | Source: Ambulatory Visit | Attending: Family Medicine | Admitting: Family Medicine

## 2016-09-18 DIAGNOSIS — Z853 Personal history of malignant neoplasm of breast: Secondary | ICD-10-CM

## 2016-09-18 DIAGNOSIS — N63 Unspecified lump in unspecified breast: Secondary | ICD-10-CM

## 2016-10-01 ENCOUNTER — Ambulatory Visit: Payer: BLUE CROSS/BLUE SHIELD | Admitting: Neurology

## 2017-03-26 ENCOUNTER — Ambulatory Visit
Admission: RE | Admit: 2017-03-26 | Discharge: 2017-03-26 | Disposition: A | Discharge status: Home / Self Care | Payer: BLUE CROSS/BLUE SHIELD | Source: Ambulatory Visit | Attending: Family Medicine | Admitting: Family Medicine

## 2017-03-26 ENCOUNTER — Other Ambulatory Visit: Payer: Self-pay | Admitting: Family Medicine

## 2017-03-26 DIAGNOSIS — N63 Unspecified lump in unspecified breast: Secondary | ICD-10-CM

## 2017-09-27 ENCOUNTER — Ambulatory Visit
Admission: RE | Admit: 2017-09-27 | Discharge: 2017-09-27 | Disposition: A | Discharge status: Home / Self Care | Payer: BLUE CROSS/BLUE SHIELD | Source: Ambulatory Visit | Attending: Family Medicine | Admitting: Family Medicine

## 2017-09-27 DIAGNOSIS — N63 Unspecified lump in unspecified breast: Secondary | ICD-10-CM

## 2018-11-15 ENCOUNTER — Other Ambulatory Visit: Payer: Self-pay | Admitting: Family Medicine

## 2018-11-15 DIAGNOSIS — Z853 Personal history of malignant neoplasm of breast: Secondary | ICD-10-CM

## 2018-11-23 ENCOUNTER — Other Ambulatory Visit: Payer: Self-pay | Admitting: Family Medicine

## 2018-11-23 DIAGNOSIS — N6002 Solitary cyst of left breast: Secondary | ICD-10-CM

## 2018-11-25 ENCOUNTER — Ambulatory Visit
Admission: RE | Admit: 2018-11-25 | Discharge: 2018-11-25 | Disposition: A | Discharge status: Home / Self Care | Payer: BLUE CROSS/BLUE SHIELD | Source: Ambulatory Visit | Attending: Family Medicine | Admitting: Family Medicine

## 2018-11-25 ENCOUNTER — Other Ambulatory Visit: Payer: Self-pay

## 2018-11-25 DIAGNOSIS — N6002 Solitary cyst of left breast: Secondary | ICD-10-CM

## 2018-11-25 DIAGNOSIS — Z853 Personal history of malignant neoplasm of breast: Secondary | ICD-10-CM

## 2019-06-27 ENCOUNTER — Other Ambulatory Visit: Payer: Self-pay | Admitting: Obstetrics and Gynecology

## 2019-06-27 ENCOUNTER — Ambulatory Visit
Admission: RE | Admit: 2019-06-27 | Discharge: 2019-06-27 | Disposition: A | Discharge status: Home / Self Care | Payer: Self-pay | Source: Ambulatory Visit | Attending: Obstetrics and Gynecology | Admitting: Obstetrics and Gynecology

## 2019-06-27 ENCOUNTER — Ambulatory Visit
Admission: RE | Admit: 2019-06-27 | Discharge: 2019-06-27 | Disposition: A | Discharge status: Home / Self Care | Payer: BC Managed Care – PPO | Source: Ambulatory Visit | Attending: Obstetrics and Gynecology | Admitting: Obstetrics and Gynecology

## 2019-06-27 DIAGNOSIS — R609 Edema, unspecified: Secondary | ICD-10-CM

## 2019-06-27 DIAGNOSIS — R52 Pain, unspecified: Secondary | ICD-10-CM

## 2019-06-27 DIAGNOSIS — K2100 Gastro-esophageal reflux disease with esophagitis, without bleeding: Secondary | ICD-10-CM | POA: Insufficient documentation

## 2019-06-27 DIAGNOSIS — D509 Iron deficiency anemia, unspecified: Secondary | ICD-10-CM | POA: Insufficient documentation

## 2019-10-10 ENCOUNTER — Other Ambulatory Visit: Payer: Self-pay | Admitting: Family Medicine

## 2019-10-10 ENCOUNTER — Other Ambulatory Visit: Payer: Self-pay | Admitting: Obstetrics and Gynecology

## 2019-10-10 DIAGNOSIS — Z1231 Encounter for screening mammogram for malignant neoplasm of breast: Secondary | ICD-10-CM

## 2019-12-01 ENCOUNTER — Other Ambulatory Visit: Payer: Self-pay

## 2019-12-01 ENCOUNTER — Ambulatory Visit
Admission: RE | Admit: 2019-12-01 | Discharge: 2019-12-01 | Disposition: A | Discharge status: Home / Self Care | Payer: BC Managed Care – PPO | Source: Ambulatory Visit | Attending: Obstetrics and Gynecology | Admitting: Obstetrics and Gynecology

## 2019-12-01 DIAGNOSIS — Z1231 Encounter for screening mammogram for malignant neoplasm of breast: Secondary | ICD-10-CM

## 2019-12-05 DIAGNOSIS — N63 Unspecified lump in unspecified breast: Secondary | ICD-10-CM | POA: Insufficient documentation

## 2019-12-06 ENCOUNTER — Other Ambulatory Visit: Payer: Self-pay | Admitting: Obstetrics and Gynecology

## 2019-12-06 DIAGNOSIS — R928 Other abnormal and inconclusive findings on diagnostic imaging of breast: Secondary | ICD-10-CM

## 2019-12-18 DIAGNOSIS — I10 Essential (primary) hypertension: Secondary | ICD-10-CM | POA: Insufficient documentation

## 2019-12-18 DIAGNOSIS — E559 Vitamin D deficiency, unspecified: Secondary | ICD-10-CM | POA: Insufficient documentation

## 2019-12-25 ENCOUNTER — Ambulatory Visit
Admission: RE | Admit: 2019-12-25 | Discharge: 2019-12-25 | Disposition: A | Discharge status: Home / Self Care | Payer: BC Managed Care – PPO | Source: Ambulatory Visit | Attending: Obstetrics and Gynecology | Admitting: Obstetrics and Gynecology

## 2019-12-25 ENCOUNTER — Other Ambulatory Visit: Payer: Self-pay

## 2019-12-25 DIAGNOSIS — R928 Other abnormal and inconclusive findings on diagnostic imaging of breast: Secondary | ICD-10-CM

## 2020-04-23 DIAGNOSIS — R519 Headache, unspecified: Secondary | ICD-10-CM | POA: Insufficient documentation

## 2020-07-03 DIAGNOSIS — M19012 Primary osteoarthritis, left shoulder: Secondary | ICD-10-CM | POA: Insufficient documentation

## 2020-07-03 DIAGNOSIS — M542 Cervicalgia: Secondary | ICD-10-CM | POA: Insufficient documentation

## 2020-07-03 DIAGNOSIS — H8190 Unspecified disorder of vestibular function, unspecified ear: Secondary | ICD-10-CM | POA: Insufficient documentation

## 2020-07-03 DIAGNOSIS — G4733 Obstructive sleep apnea (adult) (pediatric): Secondary | ICD-10-CM | POA: Insufficient documentation

## 2020-07-04 ENCOUNTER — Ambulatory Visit
Admission: RE | Admit: 2020-07-04 | Discharge: 2020-07-04 | Disposition: A | Discharge status: Home / Self Care | Payer: BC Managed Care – PPO | Source: Ambulatory Visit | Attending: Obstetrics and Gynecology | Admitting: Obstetrics and Gynecology

## 2020-07-04 ENCOUNTER — Other Ambulatory Visit: Payer: Self-pay | Admitting: Obstetrics and Gynecology

## 2020-07-04 DIAGNOSIS — R52 Pain, unspecified: Secondary | ICD-10-CM

## 2020-07-04 DIAGNOSIS — M542 Cervicalgia: Secondary | ICD-10-CM

## 2020-07-10 ENCOUNTER — Other Ambulatory Visit: Payer: Self-pay | Admitting: Obstetrics and Gynecology

## 2020-07-10 DIAGNOSIS — Z13820 Encounter for screening for osteoporosis: Secondary | ICD-10-CM

## 2020-07-10 DIAGNOSIS — Z1231 Encounter for screening mammogram for malignant neoplasm of breast: Secondary | ICD-10-CM

## 2020-08-02 DIAGNOSIS — R3121 Asymptomatic microscopic hematuria: Secondary | ICD-10-CM | POA: Insufficient documentation

## 2020-08-27 DIAGNOSIS — R110 Nausea: Secondary | ICD-10-CM | POA: Insufficient documentation

## 2020-08-27 DIAGNOSIS — J329 Chronic sinusitis, unspecified: Secondary | ICD-10-CM | POA: Insufficient documentation

## 2020-08-31 ENCOUNTER — Emergency Department (HOSPITAL_COMMUNITY)
Admission: EM | Admit: 2020-08-31 | Discharge: 2020-08-31 | Disposition: A | Discharge status: Home / Self Care | Payer: BC Managed Care – PPO | Attending: Emergency Medicine | Admitting: Emergency Medicine

## 2020-08-31 ENCOUNTER — Emergency Department (HOSPITAL_COMMUNITY): Payer: BC Managed Care – PPO

## 2020-08-31 DIAGNOSIS — R0602 Shortness of breath: Secondary | ICD-10-CM | POA: Insufficient documentation

## 2020-08-31 DIAGNOSIS — R509 Fever, unspecified: Secondary | ICD-10-CM | POA: Insufficient documentation

## 2020-08-31 DIAGNOSIS — R059 Cough, unspecified: Secondary | ICD-10-CM | POA: Diagnosis not present

## 2020-08-31 DIAGNOSIS — Z5321 Procedure and treatment not carried out due to patient leaving prior to being seen by health care provider: Secondary | ICD-10-CM | POA: Diagnosis not present

## 2020-08-31 DIAGNOSIS — R079 Chest pain, unspecified: Secondary | ICD-10-CM | POA: Diagnosis not present

## 2020-08-31 LAB — BASIC METABOLIC PANEL
Anion gap: 9 (ref 5–15)
BUN: 12 mg/dL (ref 8–23)
CO2: 22 mmol/L (ref 22–32)
Calcium: 9.3 mg/dL (ref 8.9–10.3)
Chloride: 107 mmol/L (ref 98–111)
Creatinine, Ser: 0.91 mg/dL (ref 0.44–1.00)
GFR, Estimated: >60 mL/min (ref >60)
Glucose, Bld: 111 mg/dL — ABNORMAL HIGH (ref 70–99)
Potassium: 4 mmol/L (ref 3.5–5.1)
Sodium: 138 mmol/L (ref 135–145)

## 2020-08-31 LAB — CBC
HCT: 41.5 % (ref 36.0–46.0)
Hemoglobin: 13.4 g/dL (ref 12.0–15.0)
MCH: 26.2 pg (ref 26.0–34.0)
MCHC: 32.3 g/dL (ref 30.0–36.0)
MCV: 81.2 fL (ref 80.0–100.0)
Platelets: 282 10*3/uL (ref 150–400)
RBC: 5.11 MIL/uL (ref 3.87–5.11)
RDW: 12.7 % (ref 11.5–15.5)
WBC: 8.3 10*3/uL (ref 4.0–10.5)
nRBC: 0 % (ref 0.0–0.2)

## 2020-08-31 LAB — TROPONIN I (HIGH SENSITIVITY): Troponin I (High Sensitivity): 4 ng/L (ref <18)

## 2020-08-31 MED ORDER — ALBUTEROL SULFATE HFA 108 (90 BASE) MCG/ACT IN AERS
2.0000 | INHALATION_SPRAY | Freq: Once | RESPIRATORY_TRACT | Status: AC
  Administered 2020-08-31: 2 via RESPIRATORY_TRACT
  Filled 2020-08-31: qty 6.7

## 2020-08-31 NOTE — ED Triage Notes (Signed)
Pt has had various symptoms of illness x 2 weeks. Has been on abx for symptoms but today developed shob in addition to existing cough. Chest pain with cough. Fever the last three days, which broke last night.

## 2020-08-31 NOTE — ED Provider Notes (Signed)
Emergency Medicine Provider Triage Evaluation Note  Tamara Brandt , a 60 y.o. female  was evaluated in triage.  Pt complains of cough and shortness of breath.  Patient has been ill for about 2 weeks.  When illness first began, she was evaluated at an outside site, COVID and flu tests were negative.  Strep test was negative.  Patient was started on Bactrim for possible infection.  Over the past few days, patient has had worsening symptoms including fever up to 100.7.  Today she developed increased shortness of breath.  She is now coughing up phlegm.  Recent exposure to a 24-monthold with RSV, entire family got sick after this exposure.  No history of COPD or asthma.  Has not had bronchitis for many years.  Review of Systems  Positive: Cough, sob, fever (resolved) Negative: cp  Physical Exam  BP 124/68 (BP Location: Left Arm)   Pulse 63   Temp 98.5 F (36.9 C) (Oral)   Resp 15   LMP 02/09/1989   SpO2 100%  Gen:   Awake, no distress   Resp:  Frequent cough noted on exam.  Inspiratory and expiratory wheezing.  Sats stable on room air.  Speaking in full sentences. MSK:   Moves extremities without difficulty  Other:    Medical Decision Making  Medically screening exam initiated at 1:27 PM.  Appropriate orders placed.  Tinsleigh HHarbertswas informed that the remainder of the evaluation will be completed by another provider, this initial triage assessment does not replace that evaluation, and the importance of remaining in the ED until their evaluation is complete.  Labs and chest x-ray ordered, although likely viral bronchitis.  Albuterol given for symptom control.   CFranchot Heidelberg PA-C 08/31/20 1328    HLuna Fuse MD 09/01/20 1106

## 2020-08-31 NOTE — ED Notes (Signed)
Pt stated they were not waiting no longer and were leaving.

## 2020-12-04 DIAGNOSIS — U071 COVID-19: Secondary | ICD-10-CM | POA: Insufficient documentation

## 2020-12-25 ENCOUNTER — Ambulatory Visit
Admission: RE | Admit: 2020-12-25 | Discharge: 2020-12-25 | Disposition: A | Discharge status: Home / Self Care | Payer: BC Managed Care – PPO | Source: Ambulatory Visit | Attending: Obstetrics and Gynecology | Admitting: Obstetrics and Gynecology

## 2020-12-25 ENCOUNTER — Other Ambulatory Visit: Payer: Self-pay

## 2020-12-25 DIAGNOSIS — Z13820 Encounter for screening for osteoporosis: Secondary | ICD-10-CM

## 2020-12-25 DIAGNOSIS — Z1231 Encounter for screening mammogram for malignant neoplasm of breast: Secondary | ICD-10-CM

## 2021-08-13 DIAGNOSIS — K219 Gastro-esophageal reflux disease without esophagitis: Secondary | ICD-10-CM | POA: Insufficient documentation

## 2021-11-11 ENCOUNTER — Other Ambulatory Visit: Payer: Self-pay | Admitting: Obstetrics and Gynecology

## 2021-11-11 DIAGNOSIS — Z1231 Encounter for screening mammogram for malignant neoplasm of breast: Secondary | ICD-10-CM

## 2021-12-26 ENCOUNTER — Ambulatory Visit
Admission: RE | Admit: 2021-12-26 | Discharge: 2021-12-26 | Disposition: A | Discharge status: Home / Self Care | Payer: BC Managed Care – PPO | Source: Ambulatory Visit | Attending: Obstetrics and Gynecology | Admitting: Obstetrics and Gynecology

## 2021-12-26 DIAGNOSIS — Z1231 Encounter for screening mammogram for malignant neoplasm of breast: Secondary | ICD-10-CM

## 2022-03-12 DIAGNOSIS — R7302 Impaired glucose tolerance (oral): Secondary | ICD-10-CM | POA: Insufficient documentation

## 2022-03-12 DIAGNOSIS — E785 Hyperlipidemia, unspecified: Secondary | ICD-10-CM | POA: Insufficient documentation

## 2022-04-16 DIAGNOSIS — L309 Dermatitis, unspecified: Secondary | ICD-10-CM | POA: Insufficient documentation

## 2022-04-22 DIAGNOSIS — K1120 Sialoadenitis, unspecified: Secondary | ICD-10-CM | POA: Insufficient documentation

## 2022-04-26 IMAGING — MG DIGITAL SCREENING BILAT W/ TOMO W/ CAD
8 series · 8 of 24 positions shown · non-contrast
Comparison: Previous exam(s).

CLINICAL DATA: Screening.

EXAM:
DIGITAL SCREENING BILATERAL MAMMOGRAM WITH TOMO AND CAD

[L CC synth-2D]
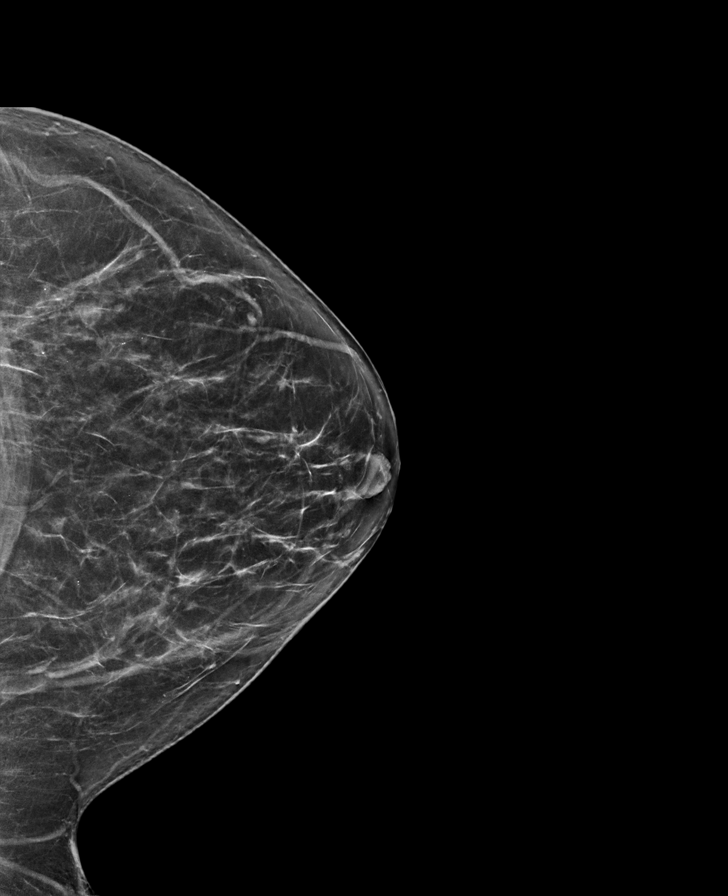

[L MLO synth-2D]
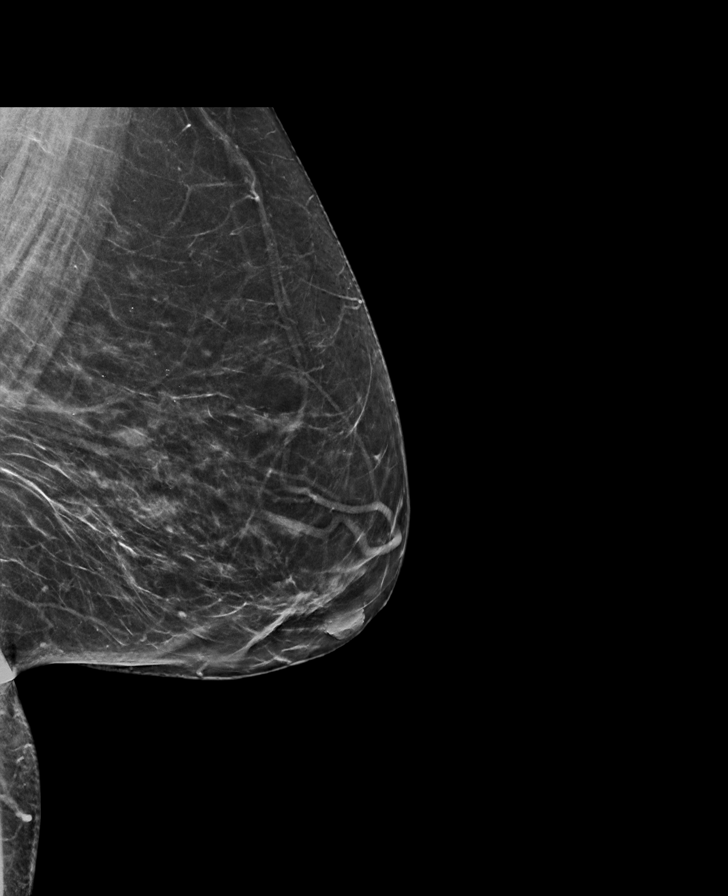

[R CC synth-2D]
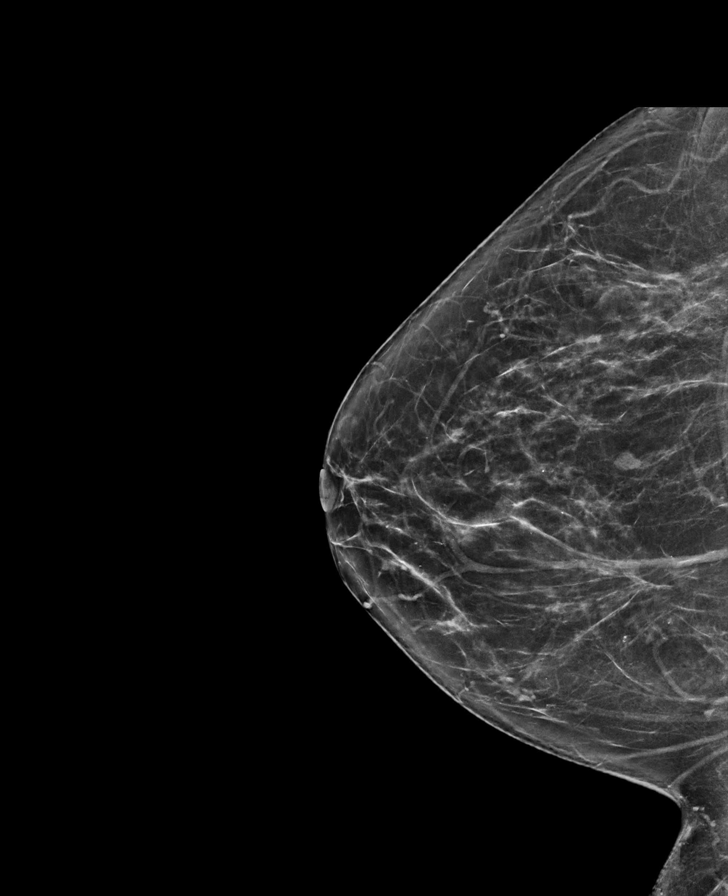

[R MLO synth-2D]
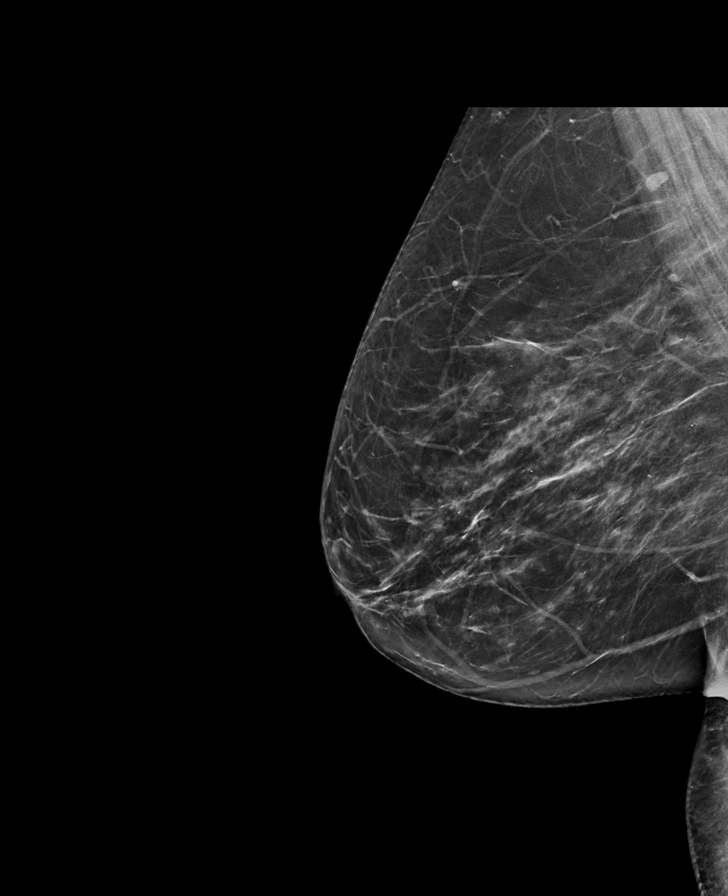

[R MLO tomo · tomo slice 36/71.0]
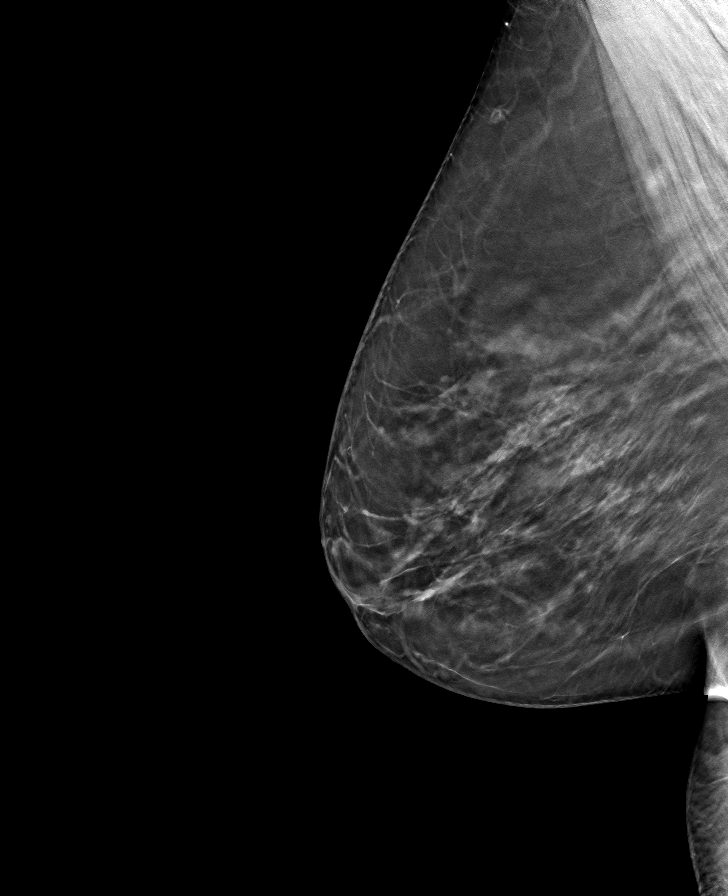

[L CC tomo · tomo slice 35/68.0]
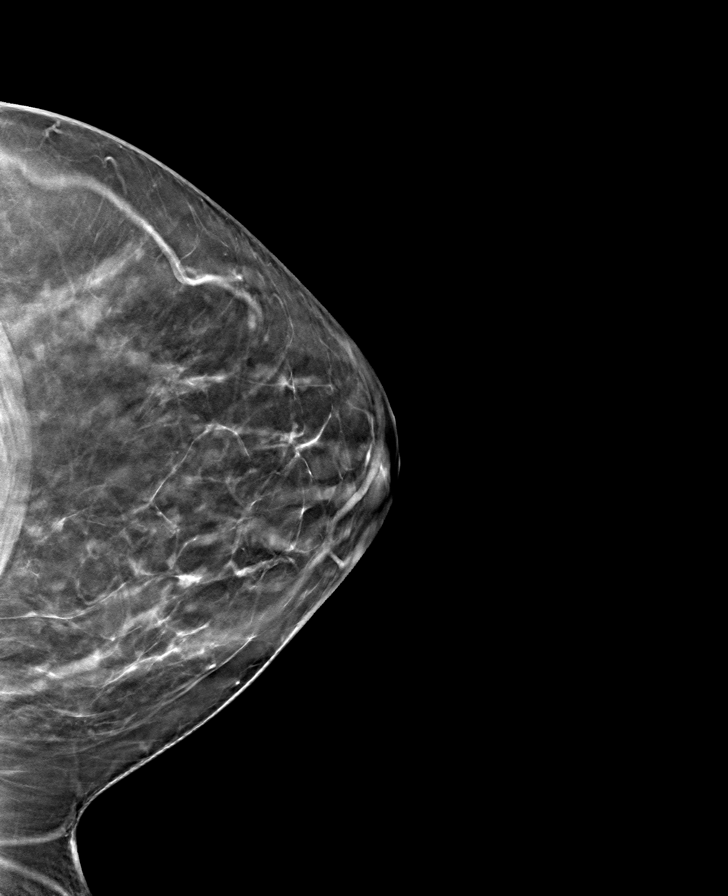

[L MLO tomo · tomo slice 35/69.0]
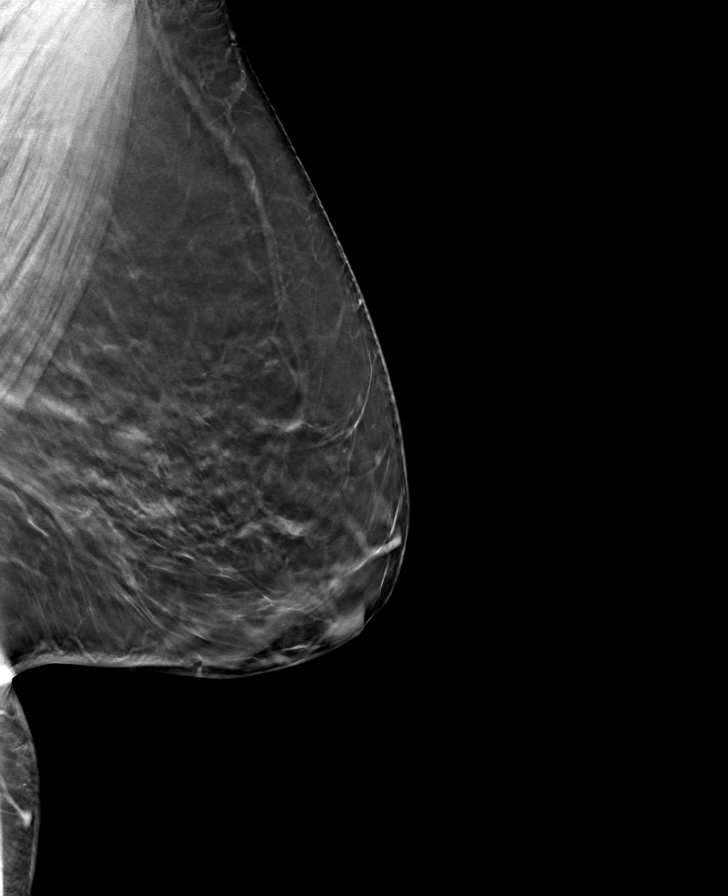

[R CC tomo · tomo slice 35/68.0]
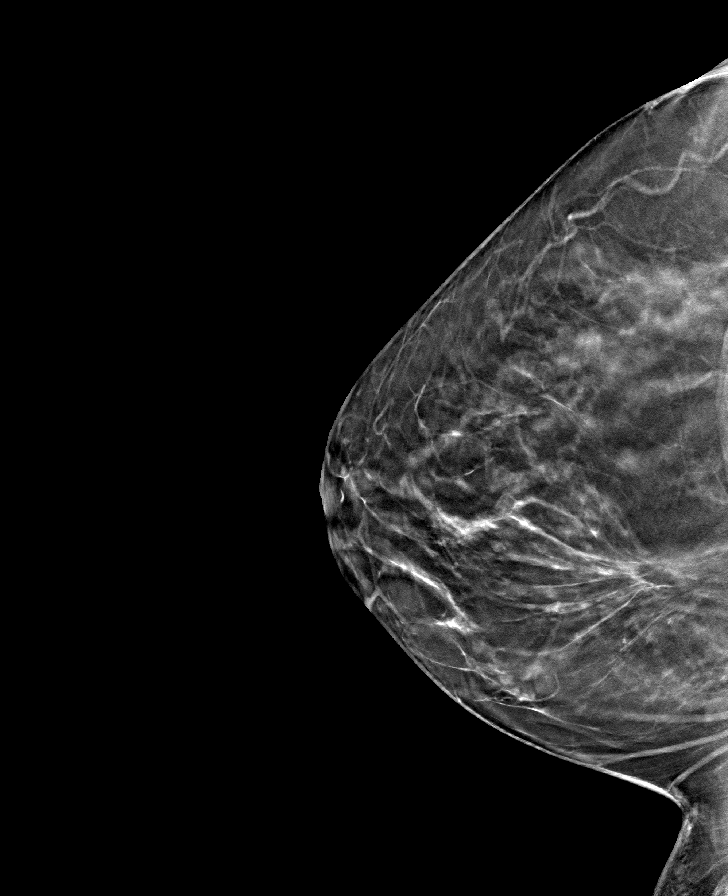

[8 of 24 positions shown; findings below may reference images not displayed]

ACR Breast Density Category b: There are scattered areas of
fibroglandular density.
FINDINGS: In the left breast, a possible mass warrants further evaluation. In
the right breast, no findings suspicious for malignancy.

Images were processed with CAD.
IMPRESSION: Further evaluation is suggested for a possible mass in the left
breast.

RECOMMENDATION:
Diagnostic mammogram and possibly ultrasound of the left breast.
(Code:[id number])

The patient will be contacted regarding the findings, and additional
imaging will be scheduled.

BI-RADS CATEGORY  0: Incomplete. Need additional imaging evaluation
and/or prior mammograms for comparison.

## 2022-05-12 ENCOUNTER — Encounter: Payer: Self-pay | Admitting: Neurology

## 2022-05-20 ENCOUNTER — Encounter: Payer: Self-pay | Admitting: Neurology

## 2022-05-20 ENCOUNTER — Ambulatory Visit: Payer: BC Managed Care – PPO | Admitting: Neurology

## 2022-05-20 VITALS — BP 120/69 | HR 93 | Ht 64.0 in | Wt 204.4 lb

## 2022-05-20 DIAGNOSIS — M129 Arthropathy, unspecified: Secondary | ICD-10-CM

## 2022-05-20 DIAGNOSIS — R519 Headache, unspecified: Secondary | ICD-10-CM

## 2022-05-20 DIAGNOSIS — H04123 Dry eye syndrome of bilateral lacrimal glands: Secondary | ICD-10-CM | POA: Diagnosis not present

## 2022-05-20 DIAGNOSIS — R221 Localized swelling, mass and lump, neck: Secondary | ICD-10-CM | POA: Diagnosis not present

## 2022-05-20 DIAGNOSIS — R42 Dizziness and giddiness: Secondary | ICD-10-CM

## 2022-05-20 MED ORDER — IMIPRAMINE HCL 25 MG PO TABS: 25.0000 mg | ORAL_TABLET | Freq: Every day | ORAL | 3 refills | Status: DC

## 2022-05-20 NOTE — Progress Notes (Signed)
[facility]  PATIENT: Tamara PlowmanClaudette P Brandt DOB: 07/17/1960  REFERRING DOCTOR OR PCP: Elyn Peersenise Talbot, FNP SOURCE: Patient, notes from primary care, imaging reports  _________________________________   HISTORICAL  CHIEF COMPLAINT:  Chief Complaint  Patient presents with   New Patient (Initial Visit)    Pt in room 11. New patient here for dizziness and headaches for over 1 month. Pt said when she gets up she feels dizzy, feels this when sitting as well. Pt headaches daily, worse when moving and when laying down.     HISTORY OF PRESENT ILLNESS:  I had the pleasure seeing your patient, [facility]imkeClaudette Ciullo, [facility] for neurologic consultation regarding her headaches and lightheadedness.  She is a 62 year old woman who had a headache and left facial swelling around 04/16/2022.  She had swelling in the left mandible region and saw her dentist and an endodontist.   She had a left sided headache temple/occiput  She was advised to see her PCP and she was started on an antibiotic and then prednisone.  Her HA improved with steroids and then worened again after the steroid stopped.    Besides the headache, she has left shoulder/neck pain.      She has some lightheadedness and she feels her hearing is slightly worse.       She notes very mild swelling in the left neck, better than it was last month.  Compared to last month, she is better but pain is still an issue.    She reportedly had a sed rate and was tod it was normal.      She notes some visual blurriness.  She had an ophthalmologic evaluation a couple weeks ago and no papilledema  She reports a dry mouth and dry eyes and sometimes uses drops.       Se was diagnosed ith RA a decade ago and was on Humira but had trouble tolerating it.   He rheumatologist (Dr. Dierdre ForthBeekman)  told her last year that OA is more a problem than RA for her.     IMAGING:  CT 326/2024 showed a partially empty sella turcica but was  otherwise normal.     CTA 05/05/2022 showed normal arteries  REVIEW OF SYSTEMS: Constitutional: No fevers, chills, sweats, or change in appetite Eyes: No visual changes, double vision, eye pain Ear, nose and throat: No hearing loss, ear pain, nasal congestion, sore throat Cardiovascular: No chest pain, palpitations Respiratory:  No shortness of breath at rest or with exertion.   No wheezes GastrointestinaI: No nausea, vomiting, diarrhea, abdominal pain, fecal incontinence Genitourinary:  No dysuria, urinary retention or frequency.  No nocturia. Musculoskeletal:  No neck pain, back pain Integumentary: No rash, pruritus, skin lesions Neurological: as above Psychiatric: No depression at this time.  No anxiety Endocrine: No palpitations, diaphoresis, change in appetite, change in weigh or increased thirst Hematologic/Lymphatic:  No anemia, purpura, petechiae. Allergic/Immunologic: No itchy/runny eyes, nasal congestion, recent allergic reactions, rashes  ALLERGIES: Allergies  Allergen Reactions   Hydrocodone Rash    HOME MEDICATIONS:  Current Outpatient Medications:    acidophilus (RISAQUAD) CAPS capsule, Take 1 capsule by mouth daily., Disp: , Rfl:    Calcium Carbonate-Vitamin D (CALCIUM 600+D) 600-400 MG-UNIT tablet, Take 1 tablet by mouth daily., Disp: , Rfl:    celecoxib (CELEBREX) 100 MG capsule, Take 100 mg by mouth 2 (two) times daily., Disp: , Rfl:    cetirizine (ZYRTEC) 10 MG tablet, Take 1 tablet (10 mg total) by  mouth daily. (Patient taking differently: Take 10 mg by mouth daily as needed for allergies.), Disp: 30 tablet, Rfl: 11   cholecalciferol (VITAMIN D) 1000 UNITS tablet, Take 1,000 Units by mouth daily., Disp: , Rfl:    esomeprazole (NEXIUM) 20 MG capsule, Take 20 mg by mouth daily at 12 noon., Disp: , Rfl:    fluticasone (FLONASE) 50 MCG/ACT nasal spray, Place 2 sprays into both nostrils daily. (Patient taking differently: Place 2 sprays into both nostrils daily as  needed for rhinitis.), Disp: 16 g, Rfl: 12   hydrALAZINE (APRESOLINE) 25 MG tablet, Take 25 mg by mouth 3 (three) times daily as needed (for itching). , Disp: , Rfl:    losartan (COZAAR) 25 MG tablet, Take 25 mg by mouth daily., Disp: , Rfl:    Multiple Vitamin (MULTIVITAMIN WITH MINERALS) TABS tablet, Take 1 tablet by mouth daily., Disp: , Rfl:    Multiple Vitamins-Minerals (HAIR/SKIN/NAILS) CAPS, Take 1 capsule by mouth daily., Disp: , Rfl:    rosuvastatin (CRESTOR) 10 MG tablet, Take 10 mg by mouth daily., Disp: , Rfl:    triamcinolone (KENALOG) 0.025 % cream, Apply 1 application topically 2 (two) times daily as needed (for itching). , Disp: , Rfl:   PAST MEDICAL HISTORY: Past Medical History:  Diagnosis Date   Allergy    Anxiety    new dx   Arthritis    Breast cancer 04/2012   left breast   Diverticulosis    GERD (gastroesophageal reflux disease)    Heart murmur    dx many yr ago-never had an echo   Inflammatory arthritis    Psoriasis    Seasonal allergies    Wears glasses     PAST SURGICAL HISTORY: Past Surgical History:  Procedure Laterality Date   ABDOMINAL HYSTERECTOMY  1991   TAH--still has ovaries   BREAST BIOPSY Left 06/21/2012   Procedure: BREAST BIOPSY WITH NEEDLE LOCALIZATION;  Surgeon: Kandis Cocking, MD;  Location: [facility];  Service: General;  Laterality: Left;   BREAST EXCISIONAL BIOPSY Left 03/31/2000   BREAST EXCISIONAL BIOPSY Right 2008   BREAST LUMPECTOMY Left 06/21/2012   CESAREAN SECTION     COLONOSCOPY     2004    FAMILY HISTORY: Family History  Problem Relation Age of Onset   Diabetes Father    Hypertension Father    Heart disease Mother    Rheum arthritis Mother    Arthritis Mother    Rheum arthritis Sister    Rheum arthritis Sister    Heart disease Paternal Aunt    Hypertension Maternal Grandmother    Stroke Maternal Grandmother     SOCIAL HISTORY: Social History   Socioeconomic History   Marital status: Married     Spouse name: Not on file   Number of children: 1   Years of education: Not on file   Highest education level: Not on file  Occupational History   Occupation: Receiptionist    Employer: [employer]  Tobacco Use   Smoking status: Former    Types: Cigarettes    Quit date: 06/15/2002    Years since quitting: 19.9   Smokeless tobacco: Never  Vaping Use   Vaping Use: Never used  Substance and Sexual Activity   Alcohol use: Yes    Comment: occ   Drug use: No   Sexual activity: Never    Partners: Male    Birth control/protection: Condom, Surgical    Comment: TAH--still has ovaries  Other Topics Concern  Not on file  Social History Narrative   Right handed    Wear glasses    No caffeine    Social Determinants of Health   Financial Resource Strain: Not on file  Food Insecurity: Not on file  Transportation Needs: Not on file  Physical Activity: Not on file  Stress: Not on file  Social Connections: Not on file  Intimate Partner Violence: Not on file       PHYSICAL EXAM  Vitals:   05/20/22 1259 05/20/22 1328  BP: (!) 144/77 120/69  Pulse: 73 93  Weight: 204 lb 6.4 oz (92.7 kg)   Height: 5\' 4"  (1.626 m)     Body mass index is 35.09 kg/m.   General: The patient is well-developed and well-nourished and in no acute distress  HEENT:  Head is [place]/AT.  Sclera are anicteric.  Funduscopic exam shows normal optic discs and retinal vessels.  Left cataract.  Neck: No carotid bruits are noted.  The neck is nontender.  No occipital tenderness.  Reasonably good range of motion.  Cardiovascular: The heart has a regular rate and rhythm with a normal S1 and S2. There were no murmurs, gallops or rubs.    Skin: Extremities are without rash or  edema.  Musculoskeletal:  Back is nontender  Neurologic Exam  Mental status: The patient is alert and oriented x 3 at the time of the examination. The patient has apparent normal recent and remote memory, with an apparently normal  attention span and concentration ability.   Speech is normal.  Cranial nerves: Extraocular movements are full. Pupils are equal, round, and reactive to light and accomodation.  Visual fields are full.  Facial symmetry is present. There is good facial sensation to soft touch bilaterally.Facial strength is normal.  Trapezius and sternocleidomastoid strength is normal. No dysarthria is noted.  The tongue is midline, and the patient has symmetric elevation of the soft palate. No obvious hearing deficits are noted.  Motor:  Muscle bulk is normal.   Tone is normal. Strength is  5 / 5 in all 4 extremities.   Sensory: Sensory testing is intact to pinprick, soft touch and vibration sensation in all 4 extremities.  Coordination: Cerebellar testing reveals good finger-nose-finger and heel-to-shin bilaterally.  Gait and station: Station is normal.   Gait is normal. Tandem gait is normal. Romberg is negative.   Reflexes: Deep tendon reflexes are symmetric and normal bilaterally.   Plantar responses are flexor.    DIAGNOSTIC DATA (LABS, IMAGING, TESTING) - I reviewed patient records, labs, notes, testing and imaging myself where available.  Lab Results  Component Value Date   WBC 8.3 08/31/2020   HGB 13.4 08/31/2020   HCT 41.5 08/31/2020   MCV 81.2 08/31/2020   PLT 282 08/31/2020      Component Value Date/Time   NA 138 08/31/2020 1321   NA 142 12/12/2012 1208   K 4.0 08/31/2020 1321   K 3.9 12/12/2012 1208   CL 107 08/31/2020 1321   CL 107 07/18/2012 1302   CO2 22 08/31/2020 1321   CO2 25 12/12/2012 1208   GLUCOSE 111 (H) 08/31/2020 1321   GLUCOSE 81 12/12/2012 1208   GLUCOSE 99 07/18/2012 1302   BUN 12 08/31/2020 1321   BUN 8.9 12/12/2012 1208   CREATININE 0.91 08/31/2020 1321   CREATININE 0.6 12/12/2012 1208   CALCIUM 9.3 08/31/2020 1321   CALCIUM 9.5 12/12/2012 1208   PROT 7.6 12/12/2012 1208   ALBUMIN 3.7 12/12/2012 1208  AST 18 12/12/2012 1208   ALT 12 12/12/2012 1208    ALKPHOS 64 12/12/2012 1208   BILITOT 0.41 12/12/2012 1208   GFRNONAA >60 08/31/2020 1321   GFRAA >60 08/06/2016 0756       ASSESSMENT AND PLAN  Nonintractable episodic headache, unspecified headache type - Plan: Sedimentation rate, ANA+ENA+DNA/DS+Scl 70+SjoSSA/B, C-reactive protein  Neck swelling  Dry eyes - Plan: ANA+ENA+DNA/DS+Scl 70+SjoSSA/B  Arthritis, multiple joint involvement - Plan: Sedimentation rate, ANA+ENA+DNA/DS+Scl 70+SjoSSA/B, C-reactive protein  Lightheaded    In summary, Ms. Brumit is a 62 year old woman with headaches over the past month.  She has some other symptoms including swelling in the left cheek/neck, lightheadedness, dry eyes and dry mouth.  Her exam was normal.  The headaches might just be chronic tension type and I will have her increase the Celebrex from 100 mg to 200 mg a day and start imipramine.  However, we also need to rule out other processes and I will check blood work for ESR, CRP, ENA, SSA/SSB.  If there is evidence of inflammation additional studies may be necessary.  She has seen rheumatology in the past.  I did not schedule follow-up but will see her back based on the results of the studies or response to therapy or if she has new or worsening symptoms.  Thank you for asking me to see Ms. Sherilyn Cooter.  Please let me know if I can be of further assistance with her or other patients in the future    Urania Pearlman A. Epimenio Foot, MD, [facility] 05/20/2022, 1:40 PM Certified in Neurology, Clinical Neurophysiology, Sleep Medicine and Neuroimaging  [facility] [address] [place], [place] [address] [phone]

## 2022-05-23 LAB — SEDIMENTATION RATE: Sed Rate: 10 mm/hr (ref 0–40)

## 2022-05-23 LAB — ANA+ENA+DNA/DS+SCL 70+SJOSSA/B
ANA Titer 1: NEGATIVE
ENA RNP Ab: <0.2 AI (ref 0.0–0.9)
ENA SM Ab Ser-aCnc: <0.2 AI (ref 0.0–0.9)
ENA SSA (RO) Ab: <0.2 AI (ref 0.0–0.9)
ENA SSB (LA) Ab: <0.2 AI (ref 0.0–0.9)
Scleroderma (Scl-70) (ENA) Antibody, IgG: <0.2 AI (ref 0.0–0.9)
dsDNA Ab: <1 IU/mL (ref 0–9)

## 2022-05-23 LAB — C-REACTIVE PROTEIN: CRP: 4 mg/L (ref 0–10)

## 2022-06-10 ENCOUNTER — Encounter: Payer: Self-pay | Admitting: Internal Medicine

## 2022-06-11 ENCOUNTER — Telehealth: Payer: Self-pay | Admitting: Neurology

## 2022-06-11 NOTE — Telephone Encounter (Signed)
Dr. Epimenio Foot- appears there is MRI head report in care everywhere from 06/04/22

## 2022-06-11 NOTE — Telephone Encounter (Signed)
Pt called wanting to know if her MRI results have been forwarded to the provider from her PCP. Please advise.

## 2022-06-29 ENCOUNTER — Ambulatory Visit: Payer: BC Managed Care – PPO | Attending: Audiologist | Admitting: Audiologist

## 2022-06-29 DIAGNOSIS — H903 Sensorineural hearing loss, bilateral: Secondary | ICD-10-CM | POA: Insufficient documentation

## 2022-06-29 DIAGNOSIS — H9312 Tinnitus, left ear: Secondary | ICD-10-CM | POA: Diagnosis present

## 2022-06-30 DIAGNOSIS — R42 Dizziness and giddiness: Secondary | ICD-10-CM | POA: Insufficient documentation

## 2022-06-30 DIAGNOSIS — R519 Headache, unspecified: Secondary | ICD-10-CM | POA: Insufficient documentation

## 2022-06-30 NOTE — Procedures (Signed)
  Outpatient Audiology and [facility] [address] [place], [place]  [address] [phone]  AUDIOLOGICAL  EVALUATION  NAME: Tamara Brandt     DOB:   [birth year]      MRN: [id number]                                                                                     DATE: 06/30/2022     REFERENT: Elson Clan, FNP STATUS: Outpatient DIAGNOSIS: Sensorineural Hearing Loss Bilateral     History: Tamara Brandt was seen for an audiological evaluation. Tamara Brandt is receiving a hearing evaluation due to concerns for unilateral tinnitus, ear and neck pain, and vertigo. She is also having headaches and feels light headed. She has tinnitus that started randomly in the left ear in May. She had swelling in the left mandible region and saw her dentist and an endodontist. They referred her to neurology.  Tamara Brandt has difficulty hearing from her left ear. Her PCP gave her steroids at the onset of he tinnitus, which helped some but then after the steroids it started again. She has had imaging and nothing was found. She has ear pain and popping. No other relevant case history reported.   Evaluation:  Otoscopy showed a clear view of the tympanic membranes, bilaterally Tympanometry results were consistent with flat response in the left ear and negative pressure in the right ear Audiometric testing was completed using conventional audiometry with insert and supraural transducer. Speech Recognition Thresholds were 15dB in the right ear and 15dB in the left ear. Word Recognition was performed 40dB SL, scored 100% in each ear. Pure tone thresholds show normal sloping to moderate sensorineural symmetric hearing loss in each ear.   Results:  The test results were reviewed with Tamara Brandt. She has some abnormal middle ear function. Her hearing is consistently with age related change, no asymmetry present. Still recommend Otolaryngology follow up due to reported symptoms.     Recommendations: 1.  Referral to ENT Physician necessary due to unilateral tinnitus and otalgia   53 minutes spent testing and counseling on results.   Ammie Ferrier  Audiologist, Au.D., CCC-A 06/30/2022  9:44 AM  Cc: Elson Clan, FNP

## 2022-07-07 DIAGNOSIS — H9209 Otalgia, unspecified ear: Secondary | ICD-10-CM | POA: Insufficient documentation

## 2022-07-07 DIAGNOSIS — H9319 Tinnitus, unspecified ear: Secondary | ICD-10-CM | POA: Insufficient documentation

## 2022-08-03 ENCOUNTER — Ambulatory Visit: Payer: BC Managed Care – PPO | Admitting: Neurology

## 2022-09-24 ENCOUNTER — Encounter: Payer: Self-pay | Admitting: Internal Medicine

## 2022-10-15 DIAGNOSIS — C50919 Malignant neoplasm of unspecified site of unspecified female breast: Secondary | ICD-10-CM | POA: Insufficient documentation

## 2022-10-15 DIAGNOSIS — L503 Dermatographic urticaria: Secondary | ICD-10-CM | POA: Insufficient documentation

## 2022-10-15 DIAGNOSIS — K5732 Diverticulitis of large intestine without perforation or abscess without bleeding: Secondary | ICD-10-CM | POA: Insufficient documentation

## 2022-10-15 DIAGNOSIS — Q283 Other malformations of cerebral vessels: Secondary | ICD-10-CM | POA: Insufficient documentation

## 2022-10-15 DIAGNOSIS — Z6835 Body mass index (BMI) 35.0-35.9, adult: Secondary | ICD-10-CM | POA: Insufficient documentation

## 2022-10-15 DIAGNOSIS — M069 Rheumatoid arthritis, unspecified: Secondary | ICD-10-CM | POA: Insufficient documentation

## 2022-10-15 DIAGNOSIS — R5382 Chronic fatigue, unspecified: Secondary | ICD-10-CM | POA: Insufficient documentation

## 2022-10-22 ENCOUNTER — Ambulatory Visit (AMBULATORY_SURGERY_CENTER): Payer: BC Managed Care – PPO | Admitting: *Deleted

## 2022-10-22 ENCOUNTER — Encounter: Payer: Self-pay | Admitting: Internal Medicine

## 2022-10-22 VITALS — Ht 63.0 in | Wt 195.0 lb

## 2022-10-22 DIAGNOSIS — Z1211 Encounter for screening for malignant neoplasm of colon: Secondary | ICD-10-CM

## 2022-10-22 MED ORDER — NA SULFATE-K SULFATE-MG SULF 17.5-3.13-1.6 GM/177ML PO SOLN: 1.0000 | Freq: Once | ORAL | 0 refills | Status: AC

## 2022-10-22 NOTE — Progress Notes (Signed)

## 2022-11-12 ENCOUNTER — Ambulatory Visit (AMBULATORY_SURGERY_CENTER): Payer: BC Managed Care – PPO | Admitting: Internal Medicine

## 2022-11-12 ENCOUNTER — Encounter: Payer: Self-pay | Admitting: Internal Medicine

## 2022-11-12 VITALS — BP 152/69 | HR 51 | Resp 13

## 2022-11-12 DIAGNOSIS — Z1211 Encounter for screening for malignant neoplasm of colon: Secondary | ICD-10-CM

## 2022-11-12 MED ORDER — SODIUM CHLORIDE 0.9 % IV SOLN: 500.0000 mL | INTRAVENOUS | Status: DC

## 2022-11-12 NOTE — Progress Notes (Signed)
Patient states there have been no changes to medical or surgical history since time of pre-visit. 

## 2022-11-12 NOTE — Patient Instructions (Signed)
Resume all of your current medications as ordered.  Read the discharge instructions given to you by your recovery room nurse.  YOU HAD AN ENDOSCOPIC PROCEDURE TODAY AT THE [facility]:   Refer to the procedure report that was given to you for any specific questions about what was found during the examination.  If the procedure report does not answer your questions, please call your gastroenterologist to clarify.  If you requested that your care partner not be given the details of your procedure findings, then the procedure report has been included in a sealed envelope for you to review at your convenience later.  YOU SHOULD EXPECT: Some feelings of bloating in the abdomen. Passage of more gas than usual.  Walking can help get rid of the air that was put into your GI tract during the procedure and reduce the bloating. If you had a lower endoscopy (such as a colonoscopy or flexible sigmoidoscopy) you may notice spotting of blood in your stool or on the toilet paper. If you underwent a bowel prep for your procedure, you may not have a normal bowel movement for a few days.  Please Note:  You might notice some irritation and congestion in your nose or some drainage.  This is from the oxygen used during your procedure.  There is no need for concern and it should clear up in a day or so.  SYMPTOMS TO REPORT IMMEDIATELY:  Following lower endoscopy (colonoscopy or flexible sigmoidoscopy):  Excessive amounts of blood in the stool  Significant tenderness or worsening of abdominal pains  Swelling of the abdomen that is new, acute  Fever of 100F or higher   For urgent or emergent issues, a gastroenterologist can be reached at any hour by calling [phone]. Do not use MyChart messaging for urgent concerns.    DIET:  We do recommend a small meal at first, but then you may proceed to your regular diet.  Drink plenty of fluids but you should avoid alcoholic beverages for 24 hours. Try to  increase the fiber in your diet, and drink plenty of water.  ACTIVITY:  You should plan to take it easy for the rest of today and you should NOT DRIVE or use heavy machinery until tomorrow (because of the sedation medicines used during the test).    FOLLOW UP: Our staff will call the number listed on your records the next business day following your procedure.  We will call around 7:15- 8:00 am to check on you and address any questions or concerns that you may have regarding the information given to you following your procedure. If we do not reach you, we will leave a message.       SIGNATURES/CONFIDENTIALITY: You and/or your care partner have signed paperwork which will be entered into your electronic medical record.  These signatures attest to the fact that that the information above on your After Visit Summary has been reviewed and is understood.  Full responsibility of the confidentiality of this discharge information lies with you and/or your care-partner.

## 2022-11-12 NOTE — Progress Notes (Signed)
Uneventful anesthetic. Report to pacu rn. Vss. Care resumed by rn. 

## 2022-11-12 NOTE — Progress Notes (Signed)
HISTORY OF PRESENT ILLNESS:  Tamara Brandt is a 62 y.o. female sent for routine screening colonoscopy.  Previous examinations 2004 and 2014 were negative for neoplasia  REVIEW OF SYSTEMS:  All non-GI ROS negative except for  Past Medical History:  Diagnosis Date   Allergy    Anxiety    new dx   Arthritis    Breast cancer (HCC) 04/2012   left breast   Cataract    "small"   Diverticulosis    GERD (gastroesophageal reflux disease)    Heart murmur    dx many yr ago-never had an echo   Hyperlipidemia    Inflammatory arthritis    Meniere's disease    Osteoporosis    Psoriasis    Seasonal allergies    Wears glasses     Past Surgical History:  Procedure Laterality Date   ABDOMINAL HYSTERECTOMY  1991   TAH--still has ovaries   BREAST BIOPSY Left 06/21/2012   Procedure: BREAST BIOPSY WITH NEEDLE LOCALIZATION;  Surgeon: Kandis Cocking, MD;  Location: [facility];  Service: General;  Laterality: Left;   BREAST EXCISIONAL BIOPSY Left 03/31/2000   BREAST EXCISIONAL BIOPSY Right 2008   BREAST LUMPECTOMY Left 06/21/2012   CESAREAN SECTION     COLONOSCOPY     2004    Social History Tamara Brandt  reports that she quit smoking about 20 years ago. Her smoking use included cigarettes. She has never used smokeless tobacco. She reports current alcohol use. She reports that she does not use drugs.  family history includes Arthritis in her mother; Colon polyps in her sister; Diabetes in her father; Heart disease in her mother and paternal aunt; Hypertension in her father and maternal grandmother; Rheum arthritis in her mother, sister, and sister; Stroke in her maternal grandmother.  Allergies  Allergen Reactions   Hydrocodone Rash    Other Reaction(s): Itchy Rash, Not available       PHYSICAL EXAMINATION: Vital signs: BP (!) 159/71   Resp 10   LMP 02/09/1989   SpO2 100%  General: Well-developed, well-nourished, no acute distress HEENT: Sclerae are  anicteric, conjunctiva pink. Oral mucosa intact Lungs: Clear Heart: Regular Abdomen: soft, nontender, nondistended, no obvious ascites, no peritoneal signs, normal bowel sounds. No organomegaly. Extremities: No edema Psychiatric: alert and oriented x3. Cooperative     ASSESSMENT:   Colon cancer screening  PLAN:  Screening colonoscopy

## 2022-11-12 NOTE — Op Note (Addendum)
[facility] Patient Name: Tamara Brandt Procedure Date: 11/12/2022 11:25 AM MRN: [id number] Endoscopist: Wilhemina Bonito. Marina Goodell , MD, [id number] Age: 62 Referring MD:  Date of Birth: 06-07-1960 Gender: Female Account #: [id number] Procedure:                Colonoscopy Indications:              Screening for colorectal malignant neoplasm. prior                            exams 2004, 2014. Medicines:                Monitored Anesthesia Care Procedure:                Pre-Anesthesia Assessment:                           - Prior to the procedure, a History and Physical                            was performed, and patient medications and                            allergies were reviewed. The patient's tolerance of                            previous anesthesia was also reviewed. The risks                            and benefits of the procedure and the sedation                            options and risks were discussed with the patient.                            All questions were answered, and informed consent                            was obtained. Prior Anticoagulants: The patient has                            taken no anticoagulant or antiplatelet agents. ASA                            Grade Assessment: II - A patient with mild systemic                            disease. After reviewing the risks and benefits,                            the patient was deemed in satisfactory condition to                            undergo the procedure.  After obtaining informed consent, the colonoscope                            was passed under direct vision. Throughout the                            procedure, the patient's blood pressure, pulse, and                            oxygen saturations were monitored continuously. The                            Olympus Scope SN: [id number] was introduced through                            the anus and advanced to the the  cecum, identified                            by appendiceal orifice and ileocecal valve. The                            ileocecal valve, appendiceal orifice, and rectum                            were photographed. The quality of the bowel                            preparation was excellent. The colonoscopy was                            performed without difficulty. The patient tolerated                            the procedure well. The bowel preparation used was                            SUPREP via split dose instruction. Scope In: 11:36:34 AM Scope Out: 11:46:36 AM Scope Withdrawal Time: 0 hours 7 minutes 41 seconds  Total Procedure Duration: 0 hours 10 minutes 2 seconds  Findings:                 Multiple diverticula were found in the sigmoid                            colon.                           The exam was otherwise without abnormality on                            direct and retroflexion views. Complications:            No immediate complications. Estimated blood loss:  None. Estimated Blood Loss:     Estimated blood loss: none. Impression:               - Diverticulosis in the sigmoid colon.                           - The examination was otherwise normal on direct                            and retroflexion views.                           - No specimens collected. Recommendation:           - Repeat colonoscopy in 10 years for screening                            purposes.                           - Patient has a contact number available for                            emergencies. The signs and symptoms of potential                            delayed complications were discussed with the                            patient. Return to normal activities tomorrow.                            Written discharge instructions were provided to the                            patient.                           - Resume previous diet.                            - Continue present medications. Wilhemina Bonito. Marina Goodell, MD 11/12/2022 11:52:45 AM This report has been signed electronically.

## 2022-11-13 ENCOUNTER — Telehealth: Payer: Self-pay | Admitting: *Deleted

## 2022-11-13 NOTE — Telephone Encounter (Signed)
  Follow up Call-     11/12/2022   10:33 AM  Call back number  Post procedure Call Back phone  # [phone]  Permission to leave phone message Yes     Patient questions:  Do you have a fever, pain , or abdominal swelling? No. Pain Score  0 *  Have you tolerated food without any problems? Yes.    Have you been able to return to your normal activities? Yes.    Do you have any questions about your discharge instructions: Diet   No. Medications  No. Follow up visit  No.  Do you have questions or concerns about your Care? No.  Actions: * If pain score is 4 or above: No action needed, pain <4.

## 2023-02-08 ENCOUNTER — Ambulatory Visit (INDEPENDENT_AMBULATORY_CARE_PROVIDER_SITE_OTHER): Payer: BC Managed Care – PPO | Admitting: Otolaryngology

## 2023-02-08 ENCOUNTER — Encounter (INDEPENDENT_AMBULATORY_CARE_PROVIDER_SITE_OTHER): Payer: Self-pay

## 2023-02-08 VITALS — BP 125/80 | HR 60 | Ht 63.0 in | Wt 198.0 lb

## 2023-02-08 DIAGNOSIS — H9193 Unspecified hearing loss, bilateral: Secondary | ICD-10-CM

## 2023-02-08 DIAGNOSIS — H8102 Meniere's disease, left ear: Secondary | ICD-10-CM | POA: Insufficient documentation

## 2023-02-08 DIAGNOSIS — H9312 Tinnitus, left ear: Secondary | ICD-10-CM | POA: Diagnosis not present

## 2023-02-08 DIAGNOSIS — R42 Dizziness and giddiness: Secondary | ICD-10-CM | POA: Insufficient documentation

## 2023-02-08 NOTE — Progress Notes (Signed)
Patient ID: Tamara Brandt, female   DOB: September 16, 1960, 63 y.o.   MRN: [id number]  Follow-up: Recurrent dizziness, tinnitus, hearing loss  HPI: The patient is a 62 year old female who returns today for her follow-up evaluation. The patient was previously seen for recurrent dizziness, left ear pressure, left ear tinnitus, and hearing loss.  Her CT scan and MRI scan were all negative.  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  She was placed on a 1500 mg low-salt diet.  The patient returns today reporting significant improvement in her left ear pressure and her dizziness.  She continues to have occasional left ear tinnitus.  She denies any change in her hearing.  Currently she denies any otalgia, otorrhea, or vertigo.  Exam: General: Communicates without difficulty, well nourished, no acute distress. Head: Normocephalic, no evidence injury, no tenderness, facial buttresses intact without stepoff. Face/sinus: No tenderness to palpation and percussion. Facial movement is normal and symmetric. Eyes: PERRL, EOMI. No scleral icterus, conjunctivae clear. Neuro: CN II exam reveals vision grossly intact.  No nystagmus at any point of gaze. Ears: Auricles well formed without lesions.  Ear canals are intact without mass or lesion.  No erythema or edema is appreciated.  The TMs are intact without fluid. Nose: External evaluation reveals normal support and skin without lesions.  Dorsum is intact.  Anterior rhinoscopy reveals congested mucosa over anterior aspect of inferior turbinates and intact septum.  No purulence noted. Oral:  Oral cavity and oropharynx are intact, symmetric, without erythema or edema.  Mucosa is moist without lesions. Neck: Full range of motion without pain.  There is no significant lymphadenopathy.  No masses palpable.  Thyroid bed within normal limits to palpation.  Parotid glands and submandibular glands equal bilaterally without mass.  Trachea is midline. Neuro:  CN  2-12 grossly intact. Vestibular: No nystagmus at any point of gaze. Dix Hallpike negative. Vestibular: There is no nystagmus with pneumatic pressure on either tympanic membrane or Valsalva. The cerebellar examination is unremarkable.   Assessment: 1.  Recurrent dizziness, secondary to left ear Mnire's disease.  Her symptoms have decreased with the use of a low-salt diet. 2.  Her electrocochleography testing was abnormal on the left side, consistent with left ear endolymphatic hydrops. 3.  Subjectively stable bilateral hearing loss and left ear tinnitus.  Plan: 1.  The physical exam findings are reviewed with the patient. 2.  The pathophysiology of Mnire's disease is again discussed with the patient.  Questions are invited and answered. 3.  The treatment options for Mnire's disease are also reviewed.  Options include low-salt diet, diuretic, steroid treatment, endolymphatic sac decompression, and ablative therapy.  4.  The patient is instructed to continue a 1500 mg low-salt diet. 5.  The strategies to cope with tinnitus, including the use of masker, hearing aids, tinnitus retraining therapy, and avoidance of caffeine and alcohol are discussed. 6.  The patient will return for reevaluation in 6 months, sooner if needed.

## 2023-02-16 ENCOUNTER — Other Ambulatory Visit: Payer: Self-pay | Admitting: Obstetrics and Gynecology

## 2023-02-16 DIAGNOSIS — Z0000 Encounter for general adult medical examination without abnormal findings: Secondary | ICD-10-CM

## 2023-03-04 ENCOUNTER — Ambulatory Visit
Admission: RE | Admit: 2023-03-04 | Discharge: 2023-03-04 | Disposition: A | Discharge status: Home / Self Care | Payer: BC Managed Care – PPO | Source: Ambulatory Visit | Attending: Obstetrics and Gynecology | Admitting: Obstetrics and Gynecology

## 2023-03-04 DIAGNOSIS — Z0000 Encounter for general adult medical examination without abnormal findings: Secondary | ICD-10-CM

## 2023-08-09 ENCOUNTER — Ambulatory Visit (INDEPENDENT_AMBULATORY_CARE_PROVIDER_SITE_OTHER): Payer: BC Managed Care – PPO | Admitting: Otolaryngology

## 2024-02-21 ENCOUNTER — Other Ambulatory Visit: Payer: Self-pay | Admitting: Internal Medicine

## 2024-02-21 DIAGNOSIS — Z1231 Encounter for screening mammogram for malignant neoplasm of breast: Secondary | ICD-10-CM

## 2024-03-06 ENCOUNTER — Ambulatory Visit

## 2024-03-15 ENCOUNTER — Ambulatory Visit

## 2024-04-03 ENCOUNTER — Ambulatory Visit
